# Patient Record
Sex: Female | Born: 1935 | Race: White | Hispanic: No | Smoking: Never smoker
Health system: Southern US, Community
[De-identification: ages and names within clinical notes are randomized; demographics above are authoritative.]

## PROBLEM LIST (undated history)

## (undated) DIAGNOSIS — K317 Polyp of stomach and duodenum: Secondary | ICD-10-CM

## (undated) DIAGNOSIS — Z86010 Personal history of colonic polyps: Secondary | ICD-10-CM

## (undated) DIAGNOSIS — K449 Diaphragmatic hernia without obstruction or gangrene: Secondary | ICD-10-CM

## (undated) DIAGNOSIS — H269 Unspecified cataract: Secondary | ICD-10-CM

## (undated) DIAGNOSIS — K649 Unspecified hemorrhoids: Secondary | ICD-10-CM

## (undated) DIAGNOSIS — Q2730 Arteriovenous malformation, site unspecified: Secondary | ICD-10-CM

## (undated) DIAGNOSIS — K5521 Angiodysplasia of colon with hemorrhage: Secondary | ICD-10-CM

## (undated) DIAGNOSIS — K2270 Barrett's esophagus without dysplasia: Secondary | ICD-10-CM

## (undated) DIAGNOSIS — M1990 Unspecified osteoarthritis, unspecified site: Secondary | ICD-10-CM

## (undated) DIAGNOSIS — K222 Esophageal obstruction: Secondary | ICD-10-CM

## (undated) DIAGNOSIS — K219 Gastro-esophageal reflux disease without esophagitis: Secondary | ICD-10-CM

## (undated) HISTORY — PX: CARPAL TUNNEL RELEASE: SHX101

## (undated) HISTORY — DX: Gastro-esophageal reflux disease without esophagitis: K21.9

## (undated) HISTORY — DX: Unspecified hemorrhoids: K64.9

## (undated) HISTORY — DX: Unspecified osteoarthritis, unspecified site: M19.90

## (undated) HISTORY — PX: UPPER GASTROINTESTINAL ENDOSCOPY: SHX188

## (undated) HISTORY — DX: Diaphragmatic hernia without obstruction or gangrene: K44.9

## (undated) HISTORY — DX: Arteriovenous malformation, site unspecified: Q27.30

## (undated) HISTORY — PX: HIATAL HERNIA REPAIR: SHX195

## (undated) HISTORY — DX: Personal history of colonic polyps: Z86.010

## (undated) HISTORY — PX: COLONOSCOPY: SHX174

## (undated) HISTORY — PX: POLYPECTOMY: SHX149

## (undated) HISTORY — DX: Unspecified cataract: H26.9

## (undated) HISTORY — DX: Barrett's esophagus without dysplasia: K22.70

## (undated) HISTORY — DX: Angiodysplasia of colon with hemorrhage: K55.21

## (undated) HISTORY — DX: Polyp of stomach and duodenum: K31.7

## (undated) HISTORY — DX: Esophageal obstruction: K22.2

## (undated) HISTORY — PX: SKIN CANCER EXCISION: SHX779

---

## 1981-02-26 HISTORY — PX: NISSEN FUNDOPLICATION: SHX2091

## 1997-11-26 ENCOUNTER — Other Ambulatory Visit: Admission: RE | Admit: 1997-11-26 | Discharge: 1997-11-26 | Payer: Self-pay | Admitting: *Deleted

## 1998-02-09 ENCOUNTER — Ambulatory Visit (HOSPITAL_COMMUNITY): Admission: RE | Admit: 1998-02-09 | Discharge: 1998-02-09 | Payer: Self-pay | Admitting: Gastroenterology

## 1998-02-09 ENCOUNTER — Encounter: Payer: Self-pay | Admitting: Gastroenterology

## 1998-06-10 ENCOUNTER — Encounter: Payer: Self-pay | Admitting: Family Medicine

## 1998-06-10 ENCOUNTER — Ambulatory Visit (HOSPITAL_COMMUNITY): Admission: RE | Admit: 1998-06-10 | Discharge: 1998-06-10 | Payer: Self-pay

## 1999-03-08 ENCOUNTER — Other Ambulatory Visit: Admission: RE | Admit: 1999-03-08 | Discharge: 1999-03-08 | Payer: Self-pay | Admitting: Family Medicine

## 1999-06-16 ENCOUNTER — Encounter: Payer: Self-pay | Admitting: Family Medicine

## 1999-06-16 ENCOUNTER — Ambulatory Visit (HOSPITAL_COMMUNITY): Admission: RE | Admit: 1999-06-16 | Discharge: 1999-06-16 | Payer: Self-pay | Admitting: Family Medicine

## 2000-04-15 ENCOUNTER — Other Ambulatory Visit: Admission: RE | Admit: 2000-04-15 | Discharge: 2000-04-15 | Payer: Self-pay | Admitting: Gastroenterology

## 2000-04-15 ENCOUNTER — Encounter (INDEPENDENT_AMBULATORY_CARE_PROVIDER_SITE_OTHER): Payer: Self-pay | Admitting: Specialist

## 2001-06-23 ENCOUNTER — Encounter: Payer: Self-pay | Admitting: Gastroenterology

## 2001-06-23 DIAGNOSIS — K649 Unspecified hemorrhoids: Secondary | ICD-10-CM | POA: Insufficient documentation

## 2001-06-23 DIAGNOSIS — K5521 Angiodysplasia of colon with hemorrhage: Secondary | ICD-10-CM | POA: Insufficient documentation

## 2001-09-04 ENCOUNTER — Encounter: Payer: Self-pay | Admitting: Family Medicine

## 2001-09-04 ENCOUNTER — Ambulatory Visit (HOSPITAL_COMMUNITY): Admission: RE | Admit: 2001-09-04 | Discharge: 2001-09-04 | Payer: Self-pay | Admitting: Family Medicine

## 2002-01-02 ENCOUNTER — Encounter: Payer: Self-pay | Admitting: Emergency Medicine

## 2002-01-02 ENCOUNTER — Inpatient Hospital Stay (HOSPITAL_COMMUNITY): Admission: EM | Admit: 2002-01-02 | Discharge: 2002-01-06 | Payer: Self-pay | Admitting: Emergency Medicine

## 2002-01-03 ENCOUNTER — Encounter: Payer: Self-pay | Admitting: Internal Medicine

## 2002-01-26 ENCOUNTER — Encounter: Admission: RE | Admit: 2002-01-26 | Discharge: 2002-01-26 | Payer: Self-pay | Admitting: Internal Medicine

## 2002-04-28 ENCOUNTER — Encounter: Admission: RE | Admit: 2002-04-28 | Discharge: 2002-04-28 | Payer: Self-pay | Admitting: Internal Medicine

## 2002-10-12 DIAGNOSIS — K219 Gastro-esophageal reflux disease without esophagitis: Secondary | ICD-10-CM

## 2004-01-04 ENCOUNTER — Ambulatory Visit: Payer: Self-pay | Admitting: Gastroenterology

## 2004-02-27 HISTORY — PX: TOE AMPUTATION: SHX809

## 2004-06-28 ENCOUNTER — Inpatient Hospital Stay (HOSPITAL_COMMUNITY): Admission: EM | Admit: 2004-06-28 | Discharge: 2004-06-30 | Payer: Self-pay | Admitting: Emergency Medicine

## 2004-10-11 ENCOUNTER — Ambulatory Visit: Payer: Self-pay | Admitting: Gastroenterology

## 2004-10-23 ENCOUNTER — Encounter (INDEPENDENT_AMBULATORY_CARE_PROVIDER_SITE_OTHER): Payer: Self-pay | Admitting: Specialist

## 2004-10-23 ENCOUNTER — Ambulatory Visit: Payer: Self-pay | Admitting: Gastroenterology

## 2004-10-23 DIAGNOSIS — K449 Diaphragmatic hernia without obstruction or gangrene: Secondary | ICD-10-CM | POA: Insufficient documentation

## 2004-10-23 DIAGNOSIS — D131 Benign neoplasm of stomach: Secondary | ICD-10-CM

## 2004-10-23 DIAGNOSIS — D1330 Benign neoplasm of unspecified part of small intestine: Secondary | ICD-10-CM

## 2004-11-09 ENCOUNTER — Ambulatory Visit (HOSPITAL_COMMUNITY): Admission: RE | Admit: 2004-11-09 | Discharge: 2004-11-09 | Payer: Self-pay | Admitting: Gastroenterology

## 2005-03-06 ENCOUNTER — Ambulatory Visit: Payer: Self-pay | Admitting: Gastroenterology

## 2006-01-03 IMAGING — CR DG FOOT COMPLETE 3+V*R*
3 series · 3 of 3 positions shown · non-contrast
Comparison: none

CLINICAL DATA: Lawnmower ran over foot.
 RIGHT FOOT- 3 VIEWS:
 There has been amputation of the great toe at the level of the proximal phalanx.  This is attached by skin, with multiple comminuted bone fragments present.  There also is amputation of the second toe at the level of the DIP.  Question of a non-displaced fracture of the distal aspect of the third proximal phalanx.
 There is debris in the soft tissues.

[view not recorded (1 of 3)]
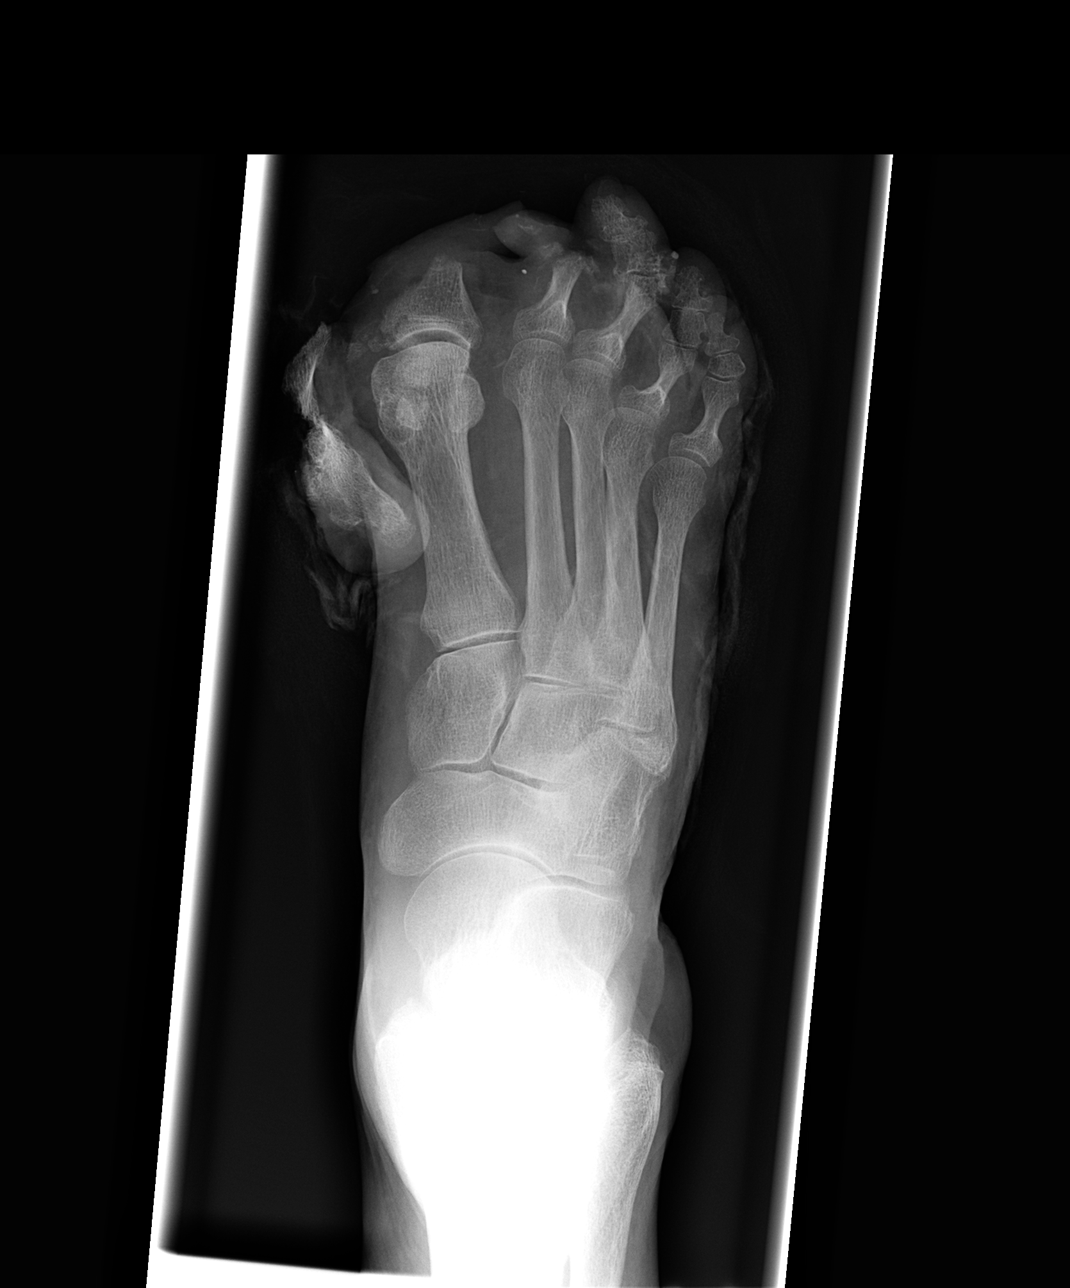

[view not recorded (2 of 3)]
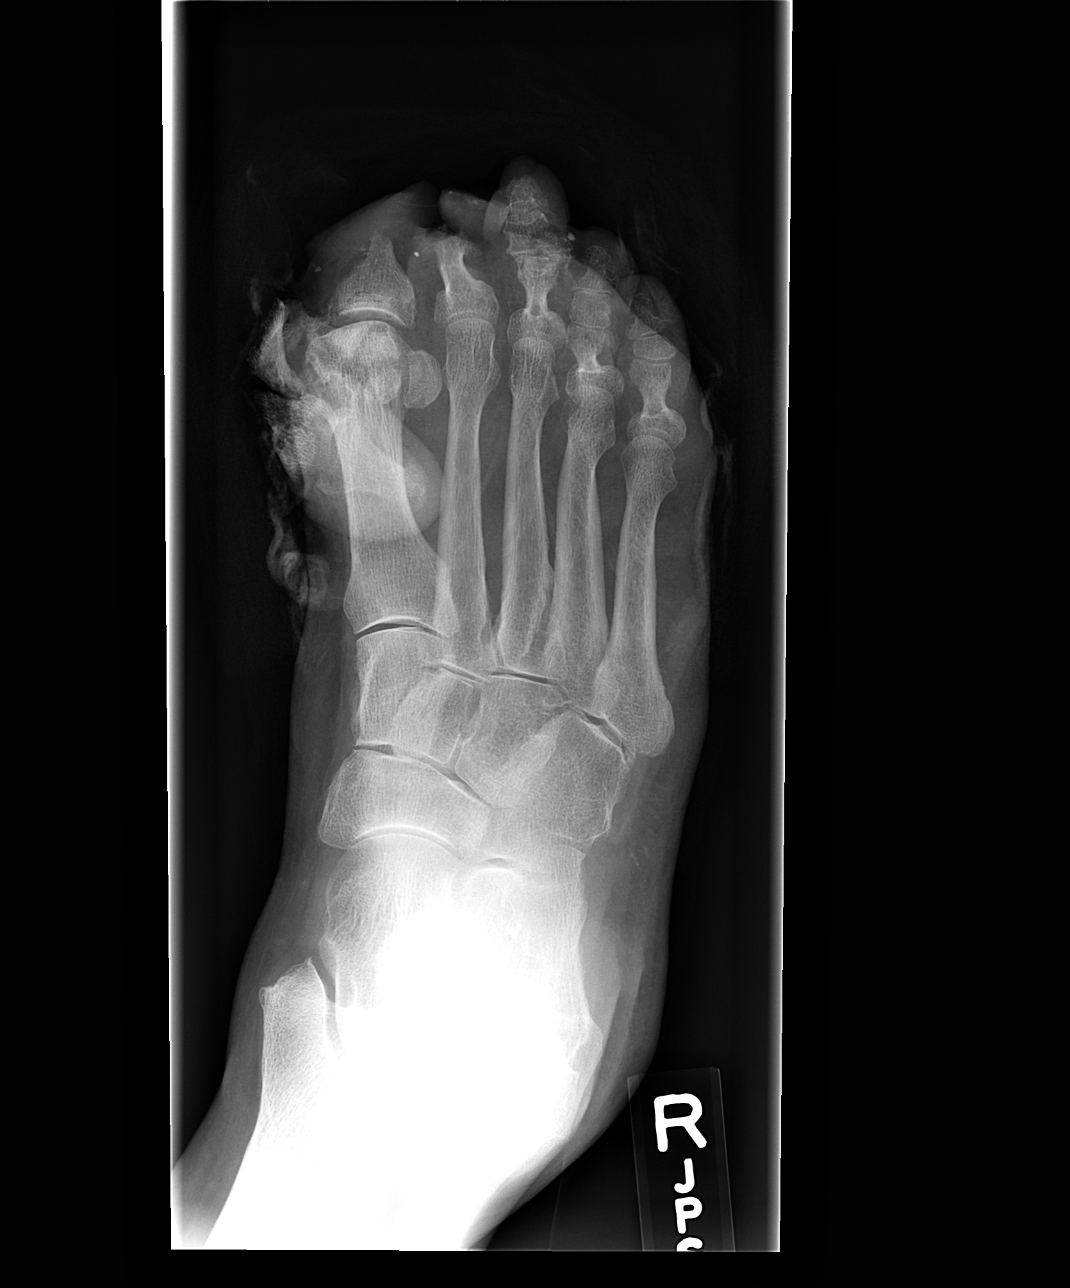

[view not recorded (3 of 3)]
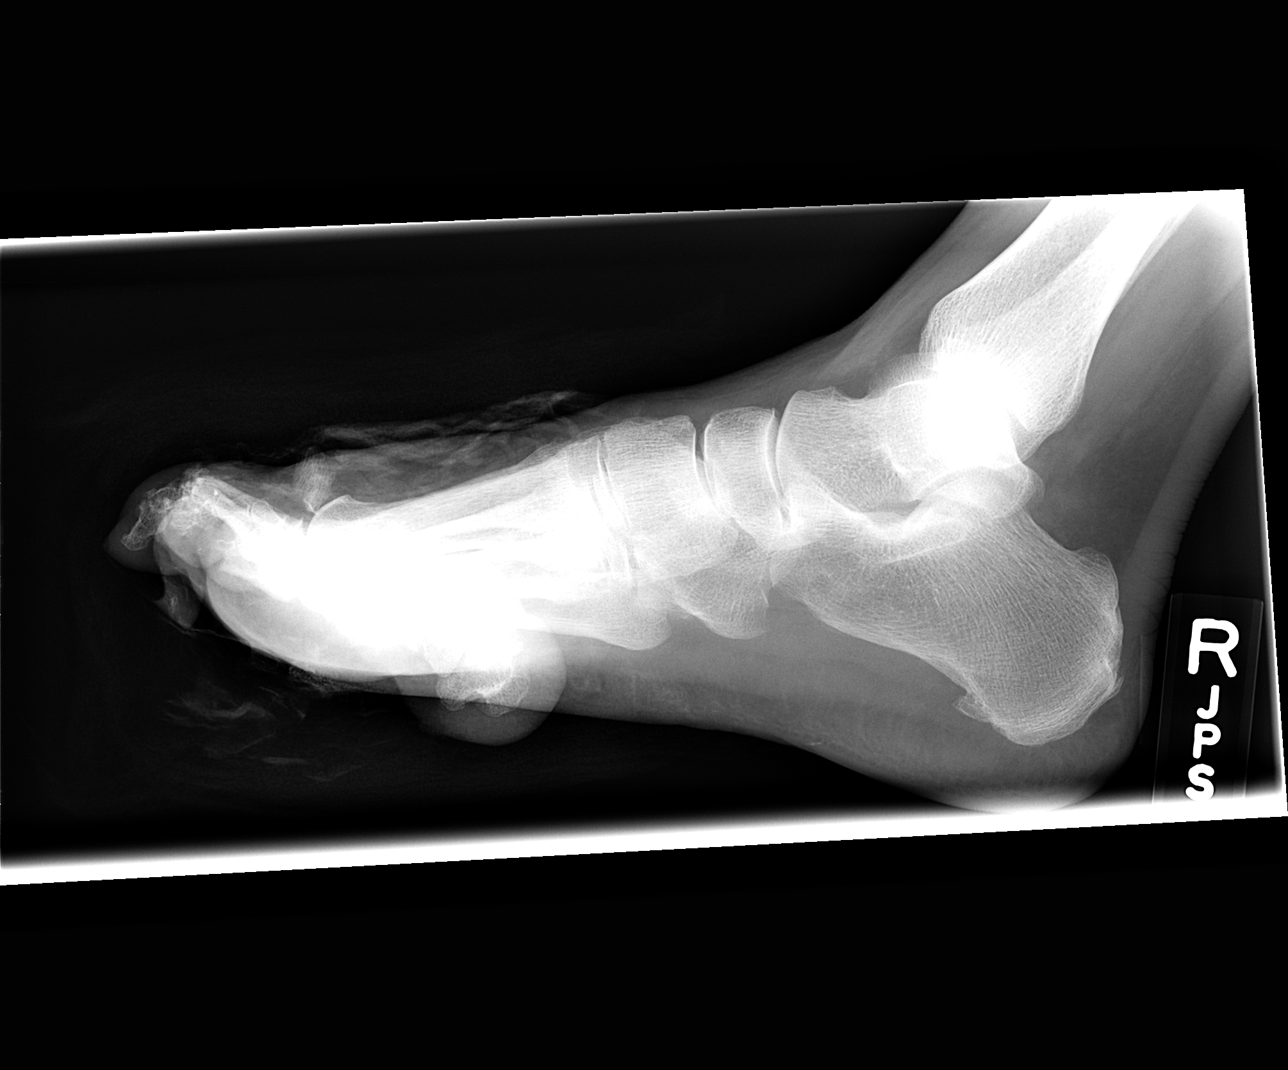

[3 of 3 positions shown; findings below may reference images not displayed]

IMPRESSION: Traumatic amputation of the first and second toes and a possible fracture of the third toe.

## 2006-06-20 ENCOUNTER — Ambulatory Visit (HOSPITAL_COMMUNITY): Admission: RE | Admit: 2006-06-20 | Discharge: 2006-06-20 | Payer: Self-pay | Admitting: Gastroenterology

## 2006-06-20 ENCOUNTER — Encounter: Payer: Self-pay | Admitting: Gastroenterology

## 2006-06-20 DIAGNOSIS — K317 Polyp of stomach and duodenum: Secondary | ICD-10-CM

## 2006-06-20 HISTORY — DX: Polyp of stomach and duodenum: K31.7

## 2006-06-21 ENCOUNTER — Encounter (INDEPENDENT_AMBULATORY_CARE_PROVIDER_SITE_OTHER): Payer: Self-pay | Admitting: *Deleted

## 2006-06-25 ENCOUNTER — Ambulatory Visit: Payer: Self-pay | Admitting: Gastroenterology

## 2006-08-23 ENCOUNTER — Ambulatory Visit: Payer: Self-pay | Admitting: Gastroenterology

## 2007-12-23 ENCOUNTER — Telehealth: Payer: Self-pay | Admitting: Gastroenterology

## 2008-01-12 DIAGNOSIS — K222 Esophageal obstruction: Secondary | ICD-10-CM

## 2008-01-12 DIAGNOSIS — K2270 Barrett's esophagus without dysplasia: Secondary | ICD-10-CM

## 2008-02-10 ENCOUNTER — Ambulatory Visit: Payer: Self-pay | Admitting: Gastroenterology

## 2008-02-27 HISTORY — PX: CATARACT EXTRACTION: SUR2

## 2008-03-15 ENCOUNTER — Encounter: Payer: Self-pay | Admitting: Gastroenterology

## 2008-03-15 ENCOUNTER — Ambulatory Visit: Payer: Self-pay | Admitting: Gastroenterology

## 2008-03-16 ENCOUNTER — Encounter: Payer: Self-pay | Admitting: Gastroenterology

## 2009-05-19 ENCOUNTER — Telehealth: Payer: Self-pay | Admitting: Gastroenterology

## 2009-06-03 ENCOUNTER — Encounter (INDEPENDENT_AMBULATORY_CARE_PROVIDER_SITE_OTHER): Payer: Self-pay | Admitting: *Deleted

## 2009-06-07 ENCOUNTER — Telehealth: Payer: Self-pay | Admitting: Gastroenterology

## 2010-03-30 NOTE — Progress Notes (Signed)
Summary: medication  Phone Note Call from Patient Call back at Home Phone [phone]   Caller: Patient Call For: Dr. Jarold Motto Reason for Call: Talk to Nurse Summary of Call: [facility] does not carry Protonix -- they only carry the generic of Protonix. Would like to discuss it with you Initial call taken by: Karna Christmas,  May 19, 2009 10:45 AM  Follow-up for Phone Call        Pt asking if it is OK to take the genreic for protonix.  Pt informed that this will be Ok.  Pt req rx be sent to [facility] at [phone]. Rx faxed as requested/ Follow-up by: Ashok Cordia RN,  May 19, 2009 1:12 PM    New/Updated Medications: PANTOPRAZOLE SODIUM 40 MG TBEC (PANTOPRAZOLE SODIUM) 1 by mouth qd Prescriptions: PANTOPRAZOLE SODIUM 40 MG TBEC (PANTOPRAZOLE SODIUM) 1 by mouth qd  #90 x 3   Entered by:   Ashok Cordia RN   Authorized by:   Mardella Layman MD [facility]   Signed by:   Ashok Cordia RN on 05/19/2009   Method used:   Printed then faxed to ...       [facility]       [address]       [place], [place]  [address]       Ph: [phone]       Fax: [phone]   RxID:   [id number]

## 2010-03-30 NOTE — Progress Notes (Signed)
Summary: COL?  Phone Note Call from Patient Call back at [facility] Phone [phone]   Caller: Patient Call For: Dr. Jarold Motto Reason for Call: Talk to Nurse Summary of Call: received recall letter for EGD... IDX indicates pt should also have COL, thus an ECL... pt doesnt think this is correct and also thinks she should have all COL's in hospital... pt would like to discuss this with nurse Initial call taken by: Vallarie Mare,  June 07, 2009 11:06 AM  Follow-up for Phone Call        Pt had EGD 1/10 and per reoprt repeat EGD not needed for 3 years.  Last colon was 2003.  Pt informed would need colon repeat every 5 years per reoprt.  Pt states she really does not want to have a colon at the present time.  Discussed reasons for having screening colonoscops with pt. Follow-up by: Ashok Cordia RN,  June 07, 2009 4:47 PM

## 2010-03-30 NOTE — Letter (Signed)
Summary: Endo/Colon Letter  [facility]  [address] [place], [place] [address]   Phone: [phone]  Fax: [phone]      June 03, 2009 MRN: [id number]   Samantha Ayala [address] [place], [place]  [address]   Dear Samantha Ayala,   According to your medical record, it is time for you to schedule an Endoscopy/Colonoscopy . Endoscopic screeening is recommended for patients with certain upper digestive tract conditions because of associated increased risk for cancers of the upper digestive system. The American Cancer Society recommends Colonoscopy as a method to detect early colon cancer. Patients with a family history of colon cancer, or a personal history of colon polyps or inflammatory bowel disease are at increased risk.  This letter has been generated based on the recommendations made at the time of your prior procedure. If you feel that in your particular situation this may no longer apply, please contact our office.  Please call our office at [phone] to schedule this appointment or to update your records at your earliest convenience.  Thank you for cooperating with [place] to provide you with the very best care possible.   Sincerely,    Vania Rea. Jarold Motto, M.D.  [facility] [phone]

## 2010-07-14 NOTE — Op Note (Signed)
NAMENYEISHA, GOODALL               ACCOUNT NO.:  [id number]   MEDICAL RECORD NO.:  [id number]          PATIENT TYPE:  INP   LOCATION:  5019                         FACILITY:  [facility]   PHYSICIAN:  Mark C. Ophelia Charter, M.D.    DATE OF BIRTH:  [birth year]   DATE OF PROCEDURE:  06/28/2004  DATE OF DISCHARGE:                                 OPERATIVE REPORT   PREOPERATIVE DIAGNOSIS:  Right foot and toe lawnmower injury with  amputations.   POSTOPERATIVE DIAGNOSIS:  Right foot and toe lawnmower injury with  amputations.   PROCEDURE:  1.  Right great toe completion of amputation at metatarsophalangeal joint.  2.  Amputation of second toe through proximal phalanx.  3.  Open reduction and internal fixation of open fracture and debridement of      third toe with pinning.   SURGEON:  Mark C. Ophelia Charter, M.D.   TOURNIQUET TIME:  Less than 45 minutes.   BRIEF HISTORY:  This 75 year old female got her foot underneath the  lawnmower with great toe hanging on by a 3-mm skin bridge, flipped back on  the dorsum of the foot with obvious open fractures and significant gross  contamination of the great toe and second toe.  The second toe was gone with  no distal tip seen and must have in the lawnmower.  The third toe had open  fractures with gross contamination of dirt and grime and grass.   PROCEDURE:  After induction of IV sedation with a preoperative ankle block,  standard Betadine scrub and prep were performed with extensive scrubbing and  debriding.  Stockinette extremity sheets and drapes were used.  The proximal  thigh tourniquet was inflated at 250 during prepping to prevent significant  bleeding.  Pulse lavage was used.  Initially, the great toe was divided,  dividing the 2- to 3-mm skin bridge, completing the amputation.  The toe was  pushed off to the side in case skin was needed later and pulse lavage was  used, debriding grime, bone and dirt.  There was about 1/4 of the proximal  phalanx  remaining, which was at an oblique angle with a sharp spike, and  flexor tendons were cut back on stretch, neurovascular bundles were  controlled with cautery and remaining portion of the proximal phalanx was  removed, completing amputation through the MP joint.  Pulsatile lavage was  used to aid in cleaning up meticulously with debridement, sponge and sharp  debridement with a scalpel, use of Mayo scissors and repeat debrided; this  was repeated 3 or 4 times, so finally the operative field was completely  clean.  An identical procedure was repeated for the second toe, which had to  have the skin trimmed back, bone debrided back from the amputation at the  proximal phalanx level.  The end of the bone was smooth and there was less  contamination in the great toe, but still a significant amount of grass and  dirt which were pulsatile-lavaged.  The third toe had contamination  similarly with medial skin intact, opening on the lateral aspect of the toe,  split  longitudinal with proximal and middle phalanx with significant  comminution involving the condyle; sharp debridement of bone with grime and  grass had to be performed.  The distal tip had good capillary refill due to  the medial neurovascular bundle.  A 0.45 K-wire was used antegrade followed  by retrograde, placing the toe in appropriate position; care was taken to  make sure it was not over-distracted.  Lateral, the neurovascular bundle was  bipolared.  Skin was closed with 3-0 nylon for all 3 toes.  Xeroform, 4 x  4's, Webril and soft dressing was applied, and the patient was transferred  to the recovery room.  Instrument count and needle count were correct.  Tourniquet was deflated prior to closure.  The patient tolerated the  procedure well.      MCY/MEDQ  D:  06/28/2004  T:  06/29/2004  Job:  [id number]

## 2010-07-14 NOTE — Discharge Summary (Signed)
NAME:  Samantha Ayala, HARTNEY                         ACCOUNT NO.:  [id number]   MEDICAL RECORD NO.:  [id number]                   PATIENT TYPE:  INP   LOCATION:  5735                                 FACILITY:  [facility]   PHYSICIAN:  Dineen Kid. Reche Dixon, M.D.               DATE OF BIRTH:  1936-01-06   DATE OF ADMISSION:  01/02/2002  DATE OF DISCHARGE:  01/06/2002                                 DISCHARGE SUMMARY   DISCHARGE DIAGNOSES:  1. Acute-on-chronic respiratory failure.  2. Question idiopathic pulmonary fibrosis.  3. Esophageal reflux.  4. Atypical pneumonia.   DISCHARGE MEDICATIONS:  1. Protonix 40 mg p.o. q.d.  2. Enteric-coated aspirin 81 mg p.o. q.d.  3. Albuterol MDI two puffs q.6h. p.r.n. shortness of breath.  4. Tussionex 10 mg 5 to 10 cc q.12h. p.r.n. cough.  5. Prednisone 60 mg for two weeks, 30 mg for two weeks, 20 mg for one month,     10 mg for one month.  6. Zithromax 250 mg p.o. for five days.  7. Calcium with vitamin D q.d.  8. Evista q.d.   CHIEF COMPLAINT:  Shortness of breath and cough.   HISTORY OF PRESENT ILLNESS:  A 75 year old white female with past medical  history significant for hiatal hernia surgery in the 70s and osteoporosis  who presents to the emergency department with two weeks of worsening cough  and increased shortness of breath. Seen by primary doctor, Dr. Jeannetta Nap, two  weeks previous to admission with fever of 102 with chills, cough with green  sputum production, and has a chest x-ray at that time suspect of pneumonia,  given Tequin for seven days. Then, returned with minimal improvement after  the seven days, was started on azithromycin a day before admission. Again  had green sputum with cough daily. The patient has had seven month to a year  history of decline in lung function with acute one week episodes, periodic  worsening of shortness of breath with improvement back to baseline. Did not  seek any medical attention at these times. Could  previously walk one mile  without shortness of breath one year ago. Was riding a bicycle for five  miles three weeks prior to admission. 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 She has had positive for orthopnea the past two  weeks. The rest of review of systems is negative.   ALLERGIES:  No known drug allergies.   SUBSTANCE HISTORY:  The patient has never smoked but has had positive  exposure as her husband smoked. No alcohol. No cocaine. No other drug use.  Currently widowed. [place], has to go outside to an outside to use the bathroom, lives  out in [place].   FAMILY HISTORY:  Mother died at the age of 82 of some type of stomach  cancer. Father died at age 37 with Alzheimer's and phlebitis. Three sisters,  one sister with emphysema secondary to smoking.   REVIEW OF SYMPTOMS:  On admission, the patient complained of fever and  chills initially. No chest pain. No palpitations. No sore throat. Just  dyspnea and dyspnea on exertion, cough with sputum production, some  constipation, some urinary frequency, some mild weakness and numbness felt  in her limbs throughout. No headache. No dizziness. No imbalance.   PHYSICAL EXAMINATION:  VITAL SIGNS:  On admission, blood pressure 113/68,  pulse 107, temperature 97.1, respiratory rate 24, O2 94% on 2 liters.  GENERAL:  Elderly pleasant white female in no acute distress.  EYES:  Pupils are equal, round, and reactive to light and accommodation.  ENT:  Upper and lower dentures. The oropharynx is clear. Swollen turbinates.  No exudate noticed in nose or oropharynx. No lymphadenopathy.  NECK:  No JVD. No thyromegaly. No carotid bruits.  RESPIRATIONS:  Crackles heard throughout lung Carreno. No consolidation. No  hyperresonance. No wheezing.  CARDIOVASCULAR:  Regular, rate, and rhythm.  No murmurs.  GASTROINTESTINAL:  Abdomen soft, nontender, nondistended, with positive  bowel sounds. No hepatosplenomegaly.  EXTREMITIES:  No edema, 2+ pulses in all extremities.  SKIN:  No skin lesions appreciated.  MUSCULOSKELETAL:  5/5 strength in all extremities.  NEUROLOGICAL:  Cranial nerves II-XII intact. No decrease in sensation. Alert  and oriented x3.  PSYCHIATRIC:  No depressive features.   LABORATORY DATA:  ABG of 7.48, CO2 of 34, O2 of 50, Aa gradient of 19, FiO2  was on room air. Sodium 133, potassium 4.5, chloride 101, bicarb 27, BUN 9,  creatinine 0.6, glucose 109. WBC 9.8, hemoglobin 12.3, platelets 335, ANC  7.8, MCV 83.   Other labs and tests obtained during hospitalization showed total protein of  6.7, albumin 3.1, AST 33, ALT 33, alkaline phosphatase 97, total bilirubin  3.6. BNP of less than 30. Urinalysis was essentially negative. ESR was 79.  Chest x-ray was read as interstitial prominence which is likely related to  chronic fibrosis. Interstitial lumenitis is not seen and felt to be less  likely at this time. No effusions, collapse, or consolidations seen. CT scan  obtained showed diffuse ground-glass appearance, question of alveolitis,  basilar dependent subsegmental atelectasis.   Pulmonary function tests show FVC of 59% of predicted, FEV1 of 52 of  predicted, FEF 25/75 37. Total lung capacity greater than 74% of predicted.  Diffusion DSB is 14% of predicted. Computerized interpretation showed mild  obstructive disease, additional mild restrictive disease, and marked  decrease in diffusion capacity. No bronchodilator study was performed.   HOSPITAL COURSE:  Problem 1. Acute-on-chronic respiratory failure. Ms.  Sonnen apparently on admission had an acute problem with shortness of  breath, possibly secondary to pneumonia. Following minimal improvement with continued bronchodilators as well as steroids, considered the probability  that this may be a pulmonary  fibrosis-type picture, given her history of  slow decline over the past year. Continued with antibiotic treatment with  Zithromax during hospitalization as well as upon discharge. Was given  Tussionex for cough during hospitalization and obtained a CT scan which  showed the above findings of ground-glass alveolitis picture. Due to the  fact that it was unclear what was going on with patient's shortness of  breath, did get a pulmonary consult with Dr. Jayme Cloud who apparently  suspected that she might have interstitial or idiopathic  pulmonary fibrosis.  Whether it came from occupational exposure versus infection versus chronic  GERD, it is hard to tell. She stated that she would not recommend a VATS  procedure at the time for tissue diagnosis and see how she responds to a  short course of steroids, initially starting with IV steroids and then  switching to p.o. with a slow taper and then follow clinically and continue  antibiotic treatment for suspected pneumonia for a total of 10 days. Upon  discharge, the patient was having much less trouble with her breathing as  well as much improvement in her cough. She was only on nebulizer treatment  p.r.n. basis and requiring minimal albuterol for symptom relief.  Problem 2. Gastroesophageal reflux disease. The patient has continued taking  Protonix 40 mg p.o. q.d. Also given the fact that she was started on a taper  of prednisone for her lung issues.   DISPOSITION:  The patient was encouraged to avoid contact with second-hand  smoke, avoid prolonged exposure to cold air. Consider moving to somewhere  where she has running water and she would not have to go outside to use the  bathroom. Upon discharge, she is going to live with her sister for several  weeks until able to find a place for her to live. She is encouraged to  return to the emergency department if any increase in shortness of breath or  she has any abnormal side effects to the medicine.  She is to return to the  outpatient clinic in two to three weeks for hospital followup. Clinic number  is given, [phone], if she has any problems or questions. She is to return  to Dr. Jeannetta Nap for any further routine health maintenance.     Catalina Pizza, M.D.                           Dineen Kid Reche Dixon, M.D.    ZH/MEDQ  D:  02/04/2002  T:  02/05/2002  Job:  [id number]   cc:   Jeannetta Nap, M.D.

## 2010-09-25 ENCOUNTER — Telehealth: Payer: Self-pay | Admitting: *Deleted

## 2010-09-25 NOTE — Telephone Encounter (Signed)
Pt agreed to schedule an ov but not a ECL. Ov 10/10/2010

## 2010-10-10 ENCOUNTER — Ambulatory Visit (INDEPENDENT_AMBULATORY_CARE_PROVIDER_SITE_OTHER): Payer: 59 | Admitting: Gastroenterology

## 2010-10-10 ENCOUNTER — Other Ambulatory Visit (INDEPENDENT_AMBULATORY_CARE_PROVIDER_SITE_OTHER): Payer: 59

## 2010-10-10 ENCOUNTER — Encounter: Payer: Self-pay | Admitting: Gastroenterology

## 2010-10-10 VITALS — BP 120/70 | HR 76 | Ht 63.0 in | Wt 210.8 lb

## 2010-10-10 DIAGNOSIS — K2270 Barrett's esophagus without dysplasia: Secondary | ICD-10-CM

## 2010-10-10 DIAGNOSIS — K219 Gastro-esophageal reflux disease without esophagitis: Secondary | ICD-10-CM

## 2010-10-10 LAB — CBC WITH DIFFERENTIAL/PLATELET
Eosinophils Absolute: 0.1 10*3/uL (ref 0.0–0.7)
HCT: 42.9 % (ref 36.0–46.0)
MCV: 87.5 fl (ref 78.0–100.0)
Monocytes Absolute: 0.5 10*3/uL (ref 0.1–1.0)
Monocytes Relative: 7.4 % (ref 3.0–12.0)
Neutrophils Relative %: 63 % (ref 43.0–77.0)
Platelets: 232 10*3/uL (ref 150.0–400.0)
RBC: 4.91 Mil/uL (ref 3.87–5.11)

## 2010-10-10 LAB — FERRITIN: Ferritin: 39.5 ng/mL (ref 10.0–291.0)

## 2010-10-10 LAB — VITAMIN B12: Vitamin B-12: 207 pg/mL — ABNORMAL LOW (ref 211–911)

## 2010-10-10 LAB — BASIC METABOLIC PANEL
BUN: 17 mg/dL (ref 6–23)
CO2: 26 mEq/L (ref 19–32)
Calcium: 9.6 mg/dL (ref 8.4–10.5)
Chloride: 103 mEq/L (ref 96–112)
Creatinine, Ser: 0.7 mg/dL (ref 0.4–1.2)
GFR: 85.63 mL/min (ref >60.00)
Glucose, Bld: 94 mg/dL (ref 70–99)
Potassium: 4.2 mEq/L (ref 3.5–5.1)

## 2010-10-10 LAB — IBC PANEL
Iron: 108 ug/dL (ref 42–145)
Saturation Ratios: 24.2 % (ref 20.0–50.0)
Transferrin: 319.4 mg/dL (ref 212.0–360.0)

## 2010-10-10 LAB — FOLATE: Folate: 23.3 ng/mL (ref >5.9)

## 2010-10-10 LAB — HEPATIC FUNCTION PANEL: Total Bilirubin: 0.6 mg/dL (ref 0.3–1.2)

## 2010-10-10 MED ORDER — PANTOPRAZOLE SODIUM 40 MG PO TBEC: 40.0000 mg | DELAYED_RELEASE_TABLET | Freq: Every day | ORAL | Status: AC

## 2010-10-10 NOTE — Progress Notes (Signed)
This is a very nice 75 year old white female with chronic GERD doing well daily Protonix therapy. She has mild constipation but is up-to-date on her colonoscopy exams. She denies gastrointestinal issues at this time. She does have Barrett's mucosa and is due for followup in January 2013. She specifically denies dysphagia, melena or hematochezia. She has had previous removal of a duodenal polyp by endoscopic techniques per Dr. Rob Bunting. The patient is due for colonoscopy but refuses exam.  Current Medications, Allergies, Past Medical History, Past Surgical History, Family History and Social History were reviewed in [employer] record.  Pertinent Review of Systems Negative   Physical Exam: Awake and alert no acute distress appears stated age. Chest is clear cardiac exam is unremarkable. There is no hepatosplenomegaly, abdominal masses or tenderness. Bowel sounds are normal. Peripheral extremities are unremarkable. Mental status is clear.    Assessment and Plan: We will continue PPI therapy with every three-year endoscopy and surveillance biopsies. The patient has refused colonoscopy. I have asked her to do IFOB records for occult blood, and we will check CBC and iron levels. I reviewed an anti-reflex regime with her, and will continue daily Protonix. Encounter Diagnosis  Name Primary?  . Barrett's esophagus Yes

## 2010-10-10 NOTE — Patient Instructions (Addendum)
Please go to the basement today for your labs.   

## 2010-10-11 ENCOUNTER — Telehealth: Payer: Self-pay | Admitting: *Deleted

## 2010-10-11 NOTE — Telephone Encounter (Signed)
Pt aware and she has no car at this time she will call back to schedule b12 injections when she gets a car.

## 2010-10-11 NOTE — Telephone Encounter (Signed)
Message copied by Leonette Monarch on Wed Oct 11, 2010  8:43 AM ------      Message from: PATTERSON, DAVID R      Created: Tue Oct 10, 2010  5:05 PM       B12 shots x3 then weekly nasal spray.

## 2010-10-12 ENCOUNTER — Ambulatory Visit (INDEPENDENT_AMBULATORY_CARE_PROVIDER_SITE_OTHER): Payer: 59 | Admitting: Gastroenterology

## 2010-10-12 DIAGNOSIS — E538 Deficiency of other specified B group vitamins: Secondary | ICD-10-CM

## 2010-10-12 MED ORDER — CYANOCOBALAMIN 1000 MCG/ML IJ SOLN
1000.0000 ug | INTRAMUSCULAR | Status: AC
  Administered 2010-10-12 – 2010-10-27 (×3): 1000 ug via INTRAMUSCULAR

## 2010-10-19 ENCOUNTER — Ambulatory Visit (INDEPENDENT_AMBULATORY_CARE_PROVIDER_SITE_OTHER): Payer: 59 | Admitting: Gastroenterology

## 2010-10-19 DIAGNOSIS — E538 Deficiency of other specified B group vitamins: Secondary | ICD-10-CM

## 2010-10-27 ENCOUNTER — Ambulatory Visit (INDEPENDENT_AMBULATORY_CARE_PROVIDER_SITE_OTHER): Payer: 59 | Admitting: Gastroenterology

## 2010-10-27 DIAGNOSIS — K5521 Angiodysplasia of colon with hemorrhage: Secondary | ICD-10-CM

## 2010-10-27 DIAGNOSIS — E538 Deficiency of other specified B group vitamins: Secondary | ICD-10-CM

## 2010-11-06 ENCOUNTER — Telehealth: Payer: Self-pay | Admitting: *Deleted

## 2010-11-06 NOTE — Telephone Encounter (Signed)
Pt returned her ifob kit and the lab is in the process of resultsing them per Archie Patten, pt aware

## 2010-11-06 NOTE — Telephone Encounter (Signed)
Message copied by Leonette Monarch on Mon Nov 06, 2010  8:57 AM ------      Message from: Harlow Mares D      Created: Thu Oct 12, 2010  4:36 PM       Check on ifob

## 2010-11-10 ENCOUNTER — Other Ambulatory Visit: Payer: Self-pay | Admitting: Gastroenterology

## 2010-11-10 ENCOUNTER — Other Ambulatory Visit: Payer: 59

## 2010-11-10 DIAGNOSIS — Z1211 Encounter for screening for malignant neoplasm of colon: Secondary | ICD-10-CM

## 2010-11-10 LAB — FECAL OCCULT BLOOD, IMMUNOCHEMICAL: Fecal Occult Bld: NEGATIVE

## 2010-11-27 ENCOUNTER — Ambulatory Visit (INDEPENDENT_AMBULATORY_CARE_PROVIDER_SITE_OTHER): Payer: 59 | Admitting: Gastroenterology

## 2010-11-27 DIAGNOSIS — E538 Deficiency of other specified B group vitamins: Secondary | ICD-10-CM

## 2010-11-27 MED ORDER — CYANOCOBALAMIN 1000 MCG/ML IJ SOLN
1000.0000 ug | Freq: Once | INTRAMUSCULAR | Status: AC
  Administered 2010-11-27: 1000 ug via INTRAMUSCULAR

## 2010-12-28 ENCOUNTER — Ambulatory Visit (INDEPENDENT_AMBULATORY_CARE_PROVIDER_SITE_OTHER): Payer: 59 | Admitting: Gastroenterology

## 2010-12-28 DIAGNOSIS — E538 Deficiency of other specified B group vitamins: Secondary | ICD-10-CM

## 2010-12-28 MED ORDER — CYANOCOBALAMIN 1000 MCG/ML IJ SOLN
1000.0000 ug | Freq: Once | INTRAMUSCULAR | Status: AC
  Administered 2010-12-28: 1000 ug via INTRAMUSCULAR

## 2011-01-29 ENCOUNTER — Other Ambulatory Visit: Payer: Self-pay | Admitting: Gastroenterology

## 2011-01-29 ENCOUNTER — Ambulatory Visit (INDEPENDENT_AMBULATORY_CARE_PROVIDER_SITE_OTHER): Payer: Medicare Other | Admitting: Gastroenterology

## 2011-01-29 DIAGNOSIS — D519 Vitamin B12 deficiency anemia, unspecified: Secondary | ICD-10-CM

## 2011-01-29 DIAGNOSIS — D518 Other vitamin B12 deficiency anemias: Secondary | ICD-10-CM

## 2011-01-29 MED ORDER — CYANOCOBALAMIN 1000 MCG/ML IJ SOLN: 1000.0000 ug | Freq: Once | INTRAMUSCULAR | Status: DC

## 2011-01-29 MED ORDER — CYANOCOBALAMIN 1000 MCG/ML IJ SOLN
1000.0000 ug | Freq: Once | INTRAMUSCULAR | Status: AC
  Administered 2011-01-29: 1000 ug via INTRAMUSCULAR

## 2011-02-05 ENCOUNTER — Telehealth: Payer: Self-pay | Admitting: Gastroenterology

## 2011-02-05 NOTE — Telephone Encounter (Signed)
b12 rx was sent to pharmacy and pt wants to get b12 in office. I called pharmacy and rx b12

## 2011-03-02 ENCOUNTER — Ambulatory Visit (INDEPENDENT_AMBULATORY_CARE_PROVIDER_SITE_OTHER): Payer: Medicare Other | Admitting: Gastroenterology

## 2011-03-02 DIAGNOSIS — E538 Deficiency of other specified B group vitamins: Secondary | ICD-10-CM

## 2011-03-02 MED ORDER — CYANOCOBALAMIN 1000 MCG/ML IJ SOLN
1000.0000 ug | Freq: Once | INTRAMUSCULAR | Status: AC
  Administered 2011-03-02: 1000 ug via INTRAMUSCULAR

## 2011-04-03 ENCOUNTER — Ambulatory Visit (INDEPENDENT_AMBULATORY_CARE_PROVIDER_SITE_OTHER): Payer: Medicare Other | Admitting: Gastroenterology

## 2011-04-03 DIAGNOSIS — E538 Deficiency of other specified B group vitamins: Secondary | ICD-10-CM

## 2011-04-03 MED ORDER — CYANOCOBALAMIN 1000 MCG/ML IJ SOLN: 1000.0000 ug | INTRAMUSCULAR | Status: DC

## 2011-04-03 MED ORDER — CYANOCOBALAMIN 1000 MCG/ML IJ SOLN
1000.0000 ug | INTRAMUSCULAR | Status: AC
  Administered 2011-04-03 – 2011-09-10 (×6): 1000 ug via INTRAMUSCULAR

## 2011-04-10 ENCOUNTER — Telehealth: Payer: Self-pay | Admitting: Gastroenterology

## 2011-04-10 NOTE — Telephone Encounter (Signed)
Now..no ov needed

## 2011-04-10 NOTE — Telephone Encounter (Signed)
Pt would like to know about scheduling her EGD since she has a hx of barretts her recall is due this year, does she need to schedule now or have an ov?

## 2011-04-11 ENCOUNTER — Encounter: Payer: Self-pay | Admitting: Gastroenterology

## 2011-04-11 NOTE — Telephone Encounter (Signed)
Pt is coming to bring her sister to have a colonoscopy today so she will stop by to schedule today.

## 2011-04-25 ENCOUNTER — Encounter: Payer: Self-pay | Admitting: Gastroenterology

## 2011-04-25 ENCOUNTER — Ambulatory Visit (AMBULATORY_SURGERY_CENTER): Payer: 59 | Admitting: *Deleted

## 2011-04-25 VITALS — Ht 63.0 in | Wt 199.0 lb

## 2011-04-25 DIAGNOSIS — K2270 Barrett's esophagus without dysplasia: Secondary | ICD-10-CM

## 2011-04-25 DIAGNOSIS — Z1211 Encounter for screening for malignant neoplasm of colon: Secondary | ICD-10-CM

## 2011-04-25 MED ORDER — PEG-KCL-NACL-NASULF-NA ASC-C 100 G PO SOLR: ORAL | Status: DC

## 2011-05-02 ENCOUNTER — Ambulatory Visit (INDEPENDENT_AMBULATORY_CARE_PROVIDER_SITE_OTHER): Payer: 59 | Admitting: Gastroenterology

## 2011-05-02 DIAGNOSIS — E538 Deficiency of other specified B group vitamins: Secondary | ICD-10-CM

## 2011-05-04 ENCOUNTER — Telehealth: Payer: Self-pay | Admitting: Gastroenterology

## 2011-05-04 ENCOUNTER — Telehealth: Payer: Self-pay | Admitting: *Deleted

## 2011-05-04 DIAGNOSIS — K2270 Barrett's esophagus without dysplasia: Secondary | ICD-10-CM

## 2011-05-04 DIAGNOSIS — Z1211 Encounter for screening for malignant neoplasm of colon: Secondary | ICD-10-CM

## 2011-05-04 MED ORDER — PEG-KCL-NACL-NASULF-NA ASC-C 100 G PO SOLR: ORAL | Status: DC

## 2011-05-04 NOTE — Telephone Encounter (Signed)
Pt called to tell us that she mixed her prep today and her procedure is not until next week.  New Rx for moviprep sent to pt's pharmacy; pt will pick it up. Pt has voucher for free prep.  Instructions for mixing and taking prep reviewed w/ pt. Ezra Sites

## 2011-05-04 NOTE — Telephone Encounter (Signed)
Pt will come by office today to pick up voucher. Samantha Ayala

## 2011-05-09 ENCOUNTER — Ambulatory Visit (AMBULATORY_SURGERY_CENTER): Payer: 59 | Admitting: Gastroenterology

## 2011-05-09 ENCOUNTER — Encounter: Payer: Medicare Other | Admitting: Gastroenterology

## 2011-05-09 ENCOUNTER — Encounter: Payer: Self-pay | Admitting: Gastroenterology

## 2011-05-09 VITALS — BP 153/62 | HR 65 | Temp 96.5°F | Resp 22 | Ht 63.0 in | Wt 199.0 lb

## 2011-05-09 DIAGNOSIS — K649 Unspecified hemorrhoids: Secondary | ICD-10-CM

## 2011-05-09 DIAGNOSIS — K2270 Barrett's esophagus without dysplasia: Secondary | ICD-10-CM

## 2011-05-09 DIAGNOSIS — K317 Polyp of stomach and duodenum: Secondary | ICD-10-CM

## 2011-05-09 DIAGNOSIS — K219 Gastro-esophageal reflux disease without esophagitis: Secondary | ICD-10-CM

## 2011-05-09 DIAGNOSIS — Z86010 Personal history of colonic polyps: Secondary | ICD-10-CM

## 2011-05-09 DIAGNOSIS — Z1211 Encounter for screening for malignant neoplasm of colon: Secondary | ICD-10-CM

## 2011-05-09 DIAGNOSIS — K5520 Angiodysplasia of colon without hemorrhage: Secondary | ICD-10-CM

## 2011-05-09 DIAGNOSIS — K5521 Angiodysplasia of colon with hemorrhage: Secondary | ICD-10-CM

## 2011-05-09 DIAGNOSIS — Z9889 Other specified postprocedural states: Secondary | ICD-10-CM

## 2011-05-09 DIAGNOSIS — K2210 Ulcer of esophagus without bleeding: Secondary | ICD-10-CM | POA: Insufficient documentation

## 2011-05-09 DIAGNOSIS — D131 Benign neoplasm of stomach: Secondary | ICD-10-CM

## 2011-05-09 MED ORDER — SODIUM CHLORIDE 0.9 % IV SOLN: 500.0000 mL | INTRAVENOUS | Status: DC

## 2011-05-09 NOTE — Patient Instructions (Signed)

## 2011-05-09 NOTE — Progress Notes (Signed)
Patient did not experience any of the following events: a burn prior to discharge; a fall within the facility; wrong site/side/patient/procedure/implant event; or a hospital transfer or hospital admission upon discharge from the facility. (G8907) Patient did not have preoperative order for IV antibiotic SSI prophylaxis. (G8918)  

## 2011-05-09 NOTE — Op Note (Signed)
[facility] [address] [place], [place]  [address]  COLONOSCOPY PROCEDURE REPORT  PATIENT:  Samantha Ayala, Samantha Ayala  MR#:  [id number] BIRTHDATE:  08-23-1935, 76 yrs. old  GENDER:  female ENDOSCOPIST:  Vania Rea. Jarold Motto, MD, [facility] REF. BY: PROCEDURE DATE:  05/09/2011 PROCEDURE:  Average-risk screening colonoscopy G0121 ASA CLASS:  Class II INDICATIONS:  history of polyps MEDICATIONS:   propofol (Diprivan) 200 mg IV  DESCRIPTION OF PROCEDURE:   After the risks and benefits and of the procedure were explained, informed consent was obtained. Digital rectal exam was performed and revealed no abnormalities. The LB CF-[id number] endoscope was introduced through the anus and advanced to the cecum, which was identified by both the appendix and ileocecal valve.  The quality of the prep was excellent, using MoviPrep.  The instrument was then slowly withdrawn as the colon was fully examined. 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 No polyps or cancers were seen.   Retroflexed views in the rectum revealed no abnormalities.    The scope was then withdrawn from the patient and the procedure completed.  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  REPEAT EXAM:  No  ______________________________ Vania Rea. Jarold Motto, MD, [facility]  CC:  Windle Guard, MD  n. Rosalie Doctor:   Vania Rea. Erling Arrazola at 05/09/2011 02:02 PM  Hortense Ramal, [phone]

## 2011-05-09 NOTE — Op Note (Signed)
[facility] [address] [place], [place]  [address]  ENDOSCOPY PROCEDURE REPORT  PATIENT:  Samantha Ayala, Samantha Ayala  MR#:  [id number] BIRTHDATE:  06-Oct-1935, 76 yrs. old  GENDER:  female  ENDOSCOPIST:  Vania Rea. Jarold Motto, MD, [facility] Referred by:  PROCEDURE DATE:  05/09/2011 PROCEDURE:  EGD with biopsy, 43239, EGD with biopsy for H. pylori 28413 ASA CLASS:  Class II INDICATIONS:  h/o Barrett's Esophagus  MEDICATIONS:   There was residual sedation effect present from prior procedure., propofol (Diprivan) 150 mg IV TOPICAL ANESTHETIC:  DESCRIPTION OF PROCEDURE:   After the risks and benefits of the procedure were explained, informed consent was obtained.  [facility] GIF-H180 [id number] endoscope was introduced through the mouth and advanced to the second portion of the duodenum.  The instrument was slowly withdrawn as the mucosa was fully examined. <<PROCEDUREIMAGES>>  Mild gastritis was found in the body and the antrum of the stomach. clo bx. done  There were multiple polyps identified. SEE PICTURES.MULTIPLE HYERPLASTIC FUNDAL POLYPS NOTED.  Barrett's esophagus was found. 7 CM OF BARRETT'S MUCOSA BIOPSIED,PROXIMAL AND DISTAL SPECIMENS OBTAINED.  Normal duodenal folds were noted. s/p fundoplication.    FUNDOPLICATION INTACT  The scope was then withdrawn from the patient and the procedure completed.  COMPLICATIONS:  None  ENDOSCOPIC IMPRESSION: 1) Mild gastritis in the body and the antrum of the stomach 2) Polyps, multiple 3) Barrett's esophagus 4) Normal duodenal folds 5) S/p fundoplication 1.S/P FUNDOPLICATION FOR REFRACTORY GERD,NOW ASYMPTOMATIC. 2.R/O DYSPLASIA IN BARRETT'S SEGMENT 3.R/O H.PYLORI 4.HYPERPLASTIC GASTRIC POLYPS FROM PPI USE. RECOMMENDATIONS: 1) Await biopsy results 2) Rx CLO if positive 3) continue current medications F/U PER PATH REPORT  ______________________________ Vania Rea. Jarold Motto, MD, [facility]  CC:  Windle Guard, MD  n. Rosalie Doctor:   Vania Rea.  [name] at 05/09/2011 02:21 PM  Hortense Ramal, [phone]

## 2011-05-10 ENCOUNTER — Telehealth: Payer: Self-pay | Admitting: *Deleted

## 2011-05-10 NOTE — Telephone Encounter (Signed)
  Follow up Call-  Call back number 05/09/2011  Post procedure Call Back phone  # [phone]   Permission to leave phone message Yes     Patient questions:  Do you have a fever, pain , or abdominal swelling? no Pain Score  0 *  Have you tolerated food without any problems? yes  Have you been able to return to your normal activities? yes  Do you have any questions about your discharge instructions: Diet   no Medications  no Follow up visit  no  Do you have questions or concerns about your Care? no  Actions: * If pain score is 4 or above: No action needed, pain <4.

## 2011-05-14 ENCOUNTER — Encounter: Payer: Self-pay | Admitting: Gastroenterology

## 2011-06-07 ENCOUNTER — Ambulatory Visit (INDEPENDENT_AMBULATORY_CARE_PROVIDER_SITE_OTHER): Payer: PRIVATE HEALTH INSURANCE | Admitting: Gastroenterology

## 2011-06-07 DIAGNOSIS — E538 Deficiency of other specified B group vitamins: Secondary | ICD-10-CM

## 2011-07-09 ENCOUNTER — Ambulatory Visit (INDEPENDENT_AMBULATORY_CARE_PROVIDER_SITE_OTHER): Payer: PRIVATE HEALTH INSURANCE | Admitting: Gastroenterology

## 2011-07-09 DIAGNOSIS — E538 Deficiency of other specified B group vitamins: Secondary | ICD-10-CM

## 2011-08-10 ENCOUNTER — Ambulatory Visit (INDEPENDENT_AMBULATORY_CARE_PROVIDER_SITE_OTHER): Payer: PRIVATE HEALTH INSURANCE | Admitting: Gastroenterology

## 2011-08-10 DIAGNOSIS — E538 Deficiency of other specified B group vitamins: Secondary | ICD-10-CM

## 2011-09-10 ENCOUNTER — Ambulatory Visit (INDEPENDENT_AMBULATORY_CARE_PROVIDER_SITE_OTHER): Payer: PRIVATE HEALTH INSURANCE | Admitting: Gastroenterology

## 2011-09-10 DIAGNOSIS — E538 Deficiency of other specified B group vitamins: Secondary | ICD-10-CM

## 2011-10-12 ENCOUNTER — Ambulatory Visit (INDEPENDENT_AMBULATORY_CARE_PROVIDER_SITE_OTHER): Payer: PRIVATE HEALTH INSURANCE | Admitting: Gastroenterology

## 2011-10-12 DIAGNOSIS — E538 Deficiency of other specified B group vitamins: Secondary | ICD-10-CM

## 2011-10-12 MED ORDER — CYANOCOBALAMIN 1000 MCG/ML IJ SOLN
1000.0000 ug | Freq: Once | INTRAMUSCULAR | Status: AC
  Administered 2011-10-12: 1000 ug via INTRAMUSCULAR

## 2011-11-12 ENCOUNTER — Ambulatory Visit (INDEPENDENT_AMBULATORY_CARE_PROVIDER_SITE_OTHER): Payer: PRIVATE HEALTH INSURANCE | Admitting: Gastroenterology

## 2011-11-12 DIAGNOSIS — E538 Deficiency of other specified B group vitamins: Secondary | ICD-10-CM

## 2011-11-12 MED ORDER — CYANOCOBALAMIN 1000 MCG/ML IJ SOLN
1000.0000 ug | Freq: Once | INTRAMUSCULAR | Status: AC
  Administered 2011-11-12: 1000 ug via INTRAMUSCULAR

## 2011-12-13 ENCOUNTER — Ambulatory Visit (INDEPENDENT_AMBULATORY_CARE_PROVIDER_SITE_OTHER): Payer: PRIVATE HEALTH INSURANCE | Admitting: Gastroenterology

## 2011-12-13 DIAGNOSIS — E538 Deficiency of other specified B group vitamins: Secondary | ICD-10-CM

## 2011-12-13 MED ORDER — CYANOCOBALAMIN 1000 MCG/ML IJ SOLN
1000.0000 ug | INTRAMUSCULAR | Status: DC
  Administered 2011-12-13: 1000 ug via INTRAMUSCULAR

## 2011-12-19 ENCOUNTER — Encounter: Payer: Self-pay | Admitting: *Deleted

## 2011-12-20 ENCOUNTER — Ambulatory Visit (INDEPENDENT_AMBULATORY_CARE_PROVIDER_SITE_OTHER): Payer: PRIVATE HEALTH INSURANCE | Admitting: Gastroenterology

## 2011-12-20 ENCOUNTER — Encounter: Payer: Self-pay | Admitting: Gastroenterology

## 2011-12-20 VITALS — BP 140/82 | HR 78 | Ht 63.0 in | Wt 194.6 lb

## 2011-12-20 DIAGNOSIS — K219 Gastro-esophageal reflux disease without esophagitis: Secondary | ICD-10-CM

## 2011-12-20 DIAGNOSIS — K2270 Barrett's esophagus without dysplasia: Secondary | ICD-10-CM

## 2011-12-20 DIAGNOSIS — E538 Deficiency of other specified B group vitamins: Secondary | ICD-10-CM

## 2011-12-20 NOTE — Patient Instructions (Addendum)
Vitamin B12 Deficiency Not having enough vitamin B12 is called a deficiency. Vitamin B12 is an important vitamin. Your body needs vitamin B12 to:   Make red blood cells.  Make DNA. This is the genetic material inside all of your cells.  Help your nerves work properly so they can carry messages from your brain to your body. CAUSES  Not eating enough foods that contain vitamin B12.  Not having enough stomach acid and digestive juices. The body needs these to absorb vitamin B12 from the food you eat.  Having certain digestive system diseases that make it hard to absorb vitamin B12. These diseases include Crohn's disease, chronic pancreatitis, and cystic fibrosis.  Having pernicious anemia, which is a condition where the body has too few red blood cells. People with this condition do not make enough of a protein called "intrinsic factor," which is needed to absorb vitamin B12.  Having a surgery in which part of the stomach or small intestine is removed.  Taking certain medicines that make it hard for the body to absorb vitamin B12. These medicines include:  Heartburn medicine (antacids and proton pump inhibitors).  A certain antibiotic medicine called neomycin, which fights infection.  Some medicines used to treat diabetes, tuberculosis, gout, and high cholesterol. RISK FACTORS Risk factors are things that make you more likely to develop a vitamin B12 deficiency. They include:  Being older than 50.  Being a vegetarian.  Being pregnant and a vegetarian or having a poor diet.  Taking certain drugs.  Being an alcoholic. SYMPTOMS You may have a vitamin B12 deficiency with no symptoms. However, a vitamin B12 deficiency can cause health problems like anemia and nerve damage. These health problems can lead to many possible symptoms, including:  Weakness.  Fatigue.  Loss of appetite.  Weight loss.  Numbness or tingling in your hands and feet.  Redness and burning of the  tongue.  Confusion or memory problems.  Depression.  Dizziness.  Sensory problems, such as loss of taste, color blindness, and ringing in the ears.  Diarrhea or constipation.  Trouble walking. DIAGNOSIS Various types of tests can be given to help find the cause of your vitamin B12 deficiency. These tests include:  A complete blood count (CBC). This test gives your caregiver an overall picture of what makes up your blood.  A blood test to measure your B12 level.  A blood test to measure intrinsic factor.  An endoscopy. This procedure uses a thin tube with a camera on the end to look into your stomach or intestines. TREATMENT Treatment for vitamin B12 deficiency depends on what is causing it. Common options include:  Changing your eating and drinking habits, such as:  Eating more foods that contain vitamin B12.  Not drinking as much alcohol or any alcohol.  Taking vitamin B12 supplements. Your caregiver will tell you what dose is best for you.  Getting vitamin B12 injections. Some people get these a few times a week. Others get them once a month. HOME CARE INSTRUCTIONS  Take all supplements as directed by your caregiver. Follow the directions carefully.  Get any injections your caregiver prescribes. Do not miss your appointments.  Eat lots of healthy foods that contain vitamin B12. Ask your caregiver if you should work with a nutritionist. Good things to include in your diet are:  Meat.  Poultry.  Fish.  Eggs.  Fortified cereal and dairy products. This means vitamin B12 has been added to the food. Check the label on the   package to be sure.  Do not abuse alcohol.  Keep all follow-up appointments. Your caregiver will need to perform blood tests to make sure your vitamin B12 deficiency is going away. SEEK MEDICAL CARE IF:  You have any questions about your treatment.  Your symptoms come back. MAKE SURE YOU:  Understand these instructions.  Will watch your  condition.  Will get help right away if you are not doing well or get worse. Document Released: 05/07/2011 Document Reviewed: 05/07/2011 [facility] Patient Information 2013 [employer], [place]. ________________________________________________  Cyanocobalamin, Vitamin B12 injection What is this medicine? CYANOCOBALAMIN (sye an oh koe BAL a min) is a man made form of vitamin B12. Vitamin B12 is used in the growth of healthy blood cells, nerve cells, and proteins in the body. It also helps with the metabolism of fats and carbohydrates. This medicine is used to treat people who can not absorb vitamin B12. This medicine may be used for other purposes; ask your health care provider or pharmacist if you have questions. What should I tell my health care provider before I take this medicine? They need to know if you have any of these conditions: -kidney disease -Leber's disease -megaloblastic anemia -an unusual or allergic reaction to cyanocobalamin, cobalt, other medicines, foods, dyes, or preservatives -pregnant or trying to get pregnant -breast-feeding How should I use this medicine? This medicine is injected into a muscle or deeply under the skin. It is usually given by a health care professional in a clinic or doctor's office. However, your doctor may teach you how to inject yourself. Follow all instructions. Talk to your pediatrician regarding the use of this medicine in children. Special care may be needed. Overdosage: If you think you have taken too much of this medicine contact a poison control center or emergency room at once. NOTE: This medicine is only for you. Do not share this medicine with others. What if I miss a dose? If you are given your dose at a clinic or doctor's office, call to reschedule your appointment. If you give your own injections and you miss a dose, take it as soon as you can. If it is almost time for your next dose, take only that dose. Do not take double or extra  doses. What may interact with this medicine? -colchicine -heavy alcohol intake This list may not describe all possible interactions. Give your health care provider a list of all the medicines, herbs, non-prescription drugs, or dietary supplements you use. Also tell them if you smoke, drink alcohol, or use illegal drugs. Some items may interact with your medicine. What should I watch for while using this medicine? Visit your doctor or health care professional regularly. You may need blood work done while you are taking this medicine. You may need to follow a special diet. Talk to your doctor. Limit your alcohol intake and avoid smoking to get the best benefit. What side effects may I notice from receiving this medicine? Side effects that you should report to your doctor or health care professional as soon as possible: -allergic reactions like skin rash, itching or hives, swelling of the face, lips, or tongue -blue tint to skin -chest tightness, pain -difficulty breathing, wheezing -dizziness -red, swollen painful area on the leg Side effects that usually do not require medical attention (report to your doctor or health care professional if they continue or are bothersome): -diarrhea -headache This list may not describe all possible side effects. Call your doctor for medical advice about side effects. You  may report side effects to FDA at 1-800-FDA-1088. Where should I keep my medicine? Keep out of the reach of children. Store at room temperature between 15 and 30 degrees C (59 and 85 degrees F). Protect from light. Throw away any unused medicine after the expiration date. NOTE: This sheet is a summary. It may not cover all possible information. If you have questions about this medicine, talk to your doctor, pharmacist, or health care provider.  2012, Elsevier/Gold Standard. (05/26/2007 10:10:20 PM)

## 2011-12-20 NOTE — Progress Notes (Signed)
History of Present Illness: This is a complicated 76 year old Caucasian female with chronic GERD and recent endoscopy which documented chronic Barrett's mucosa without evidence of dysplasia. She is doing well on Protonix 40 mg a day. As part of her workup she was found to be 12 deficient, and has been on parenteral replacement therapy. She currently refuses further shots, and says that she has ordered by mouth B12 supplements that she will take by mouth. Currently she denies chest pain, abdominal pain, or dysphagia. Her appetite is good her weight is stable. Review of labs from August of 2012 showed a normal CBC, liver profile, and metabolic profile. Patient is status post fundoplication. Recent gastric biopsies were negative for H. pylori infection.    Current Medications, Allergies, Past Medical History, Past Surgical History, Family History and Social History were reviewed in [employer] record.   Assessment and plan: Continue daily PPI therapy and antireflux regime with every three-year endoscopy and biopsies unless otherwise indicated. I tried at length and explained B12 issues with this patient, but I do not think she will follow my recommendations. She is up-to-date her colonoscopy exams. She is to continue other medications as per primary care. Colonoscopy was negative in March of this year. No diagnosis found.

## 2012-01-14 ENCOUNTER — Ambulatory Visit (INDEPENDENT_AMBULATORY_CARE_PROVIDER_SITE_OTHER): Payer: PRIVATE HEALTH INSURANCE | Admitting: Gastroenterology

## 2012-01-14 DIAGNOSIS — E538 Deficiency of other specified B group vitamins: Secondary | ICD-10-CM

## 2012-01-14 MED ORDER — CYANOCOBALAMIN 1000 MCG/ML IJ SOLN
1000.0000 ug | Freq: Once | INTRAMUSCULAR | Status: AC
  Administered 2012-01-14: 1000 ug via INTRAMUSCULAR

## 2012-03-07 ENCOUNTER — Ambulatory Visit (INDEPENDENT_AMBULATORY_CARE_PROVIDER_SITE_OTHER): Payer: PRIVATE HEALTH INSURANCE | Admitting: Gastroenterology

## 2012-03-07 DIAGNOSIS — E538 Deficiency of other specified B group vitamins: Secondary | ICD-10-CM

## 2012-03-07 MED ORDER — CYANOCOBALAMIN 1000 MCG/ML IJ SOLN
1000.0000 ug | INTRAMUSCULAR | Status: AC
  Administered 2012-03-07 – 2012-04-08 (×2): 1000 ug via INTRAMUSCULAR

## 2012-04-08 ENCOUNTER — Ambulatory Visit (INDEPENDENT_AMBULATORY_CARE_PROVIDER_SITE_OTHER): Payer: PRIVATE HEALTH INSURANCE | Admitting: Gastroenterology

## 2012-04-08 DIAGNOSIS — E538 Deficiency of other specified B group vitamins: Secondary | ICD-10-CM

## 2012-04-23 ENCOUNTER — Telehealth: Payer: Self-pay | Admitting: Gastroenterology

## 2012-04-23 DIAGNOSIS — E538 Deficiency of other specified B group vitamins: Secondary | ICD-10-CM

## 2012-04-23 NOTE — Telephone Encounter (Signed)
Pt has been taking Vit B12 injections since 10/12/10 ; pt wants to know if she has to stay on the injections for the rest of her life? We discussed absorption and age issues. Since we don't have any in stock, pt would like a B12 level to see if she can stop injections for a while? Thanks.

## 2012-04-23 NOTE — Telephone Encounter (Signed)
Lab, B12 entered and pt will come at her convenience to have drawn.

## 2012-04-23 NOTE — Telephone Encounter (Signed)
ok 

## 2012-04-24 ENCOUNTER — Other Ambulatory Visit (INDEPENDENT_AMBULATORY_CARE_PROVIDER_SITE_OTHER): Payer: PRIVATE HEALTH INSURANCE

## 2012-04-24 DIAGNOSIS — E538 Deficiency of other specified B group vitamins: Secondary | ICD-10-CM

## 2012-04-24 LAB — VITAMIN B12: Vitamin B-12: 554 pg/mL (ref 211–911)

## 2013-03-18 ENCOUNTER — Encounter: Payer: Self-pay | Admitting: Gastroenterology

## 2014-12-17 ENCOUNTER — Encounter: Payer: Self-pay | Admitting: Gastroenterology

## 2015-06-09 ENCOUNTER — Encounter: Payer: Self-pay | Admitting: Gastroenterology

## 2015-06-29 NOTE — H&P (Signed)
  Samantha Ayala is an 80 y.o. female.    Chief Complaint: left knee pain  HPI: Pt is a 81 y.o. female complaining of left knee pain for multiple years. Pain had continually increased since the beginning. X-rays in the clinic show end-stage arthritic changes of the left knee. Pt has tried various conservative treatments which have failed to alleviate their symptoms, including injections and therapy. Various options are discussed with the patient. Risks, benefits and expectations were discussed with the patient. Patient understand the risks, benefits and expectations and wishes to proceed with surgery.   PCP:  Leonard Downing, MD  D/C Plans: Home  PMH: Past Medical History  Diagnosis Date  . Stricture and stenosis of esophagus   . Unspecified hemorrhoids without mention of complication   . Angiodysplasia of intestine with hemorrhage   . Esophageal reflux   . Hiatal hernia   . Barrett esophagus   . Gastric polyp 06/20/2006    HYPERPLASTIC POLYP  . Personal history of colonic polyps   . Arthritis   . Osteoporosis   . GERD (gastroesophageal reflux disease)   . 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 Nissen fundoplication  1638  . Cataract extraction  2010    bilateral  . Toe amputation  2006    Social History:  reports that she has never smoked. She has never used smokeless tobacco. She reports that she does not drink alcohol or use illicit drugs.  Allergies:  No Known Allergies  Medications: No current facility-administered medications for this encounter.   Current Outpatient Prescriptions  Medication Sig Dispense Refill  . alendronate (FOSAMAX) 70 MG tablet Take 70 mg by mouth every 7 (seven) days. Take with a full glass of water on an empty stomach.     Marland Kitchen aspirin 81 MG tablet Take 81 mg by mouth daily.    . calcium-vitamin D (OSCAL WITH D) 500-200 MG-UNIT per tablet Take 1 tablet by mouth  daily.      . furosemide (LASIX) 40 MG tablet Take 40 mg by mouth daily.      . naproxen sodium (ANAPROX) 220 MG tablet Take 220 mg by mouth daily.      . pantoprazole (PROTONIX) 40 MG tablet Take 1 tablet (40 mg total) by mouth daily. 30 tablet 11  . peg 3350 powder (MOVIPREP) 100 G SOLR moviprep as directed 1 kit 0    No results found for this or any previous visit (from the past 48 hour(s)). No results found.  ROS: Pain with rom of the left lower extremity  Physical Exam:  Alert and oriented 80 y.o. female in no acute distress Cranial nerves 2-12 intact Cervical spine: full rom with no tenderness, nv intact distally Chest: active breath sounds bilaterally, no wheeze rhonchi or rales Heart: regular rate and rhythm, no murmur Abd: non tender non distended with active bowel sounds Hip is stable with rom  Left knee with moderate crepitus with rom nv intact distally Antalgic gait  Assessment/Plan Assessment: left knee end stage osteoarthritis   Plan: Patient will undergo a left total knee arthroplasty by Dr. Veverly Fells at [facility]. Risks benefits and expectations were discussed with the patient. Patient understand risks, benefits and expectations and wishes to proceed.

## 2015-07-01 ENCOUNTER — Other Ambulatory Visit (HOSPITAL_COMMUNITY): Payer: Self-pay | Admitting: *Deleted

## 2015-07-01 NOTE — Pre-Procedure Instructions (Signed)
    Samantha Ayala  07/01/2015      [facility] - [place] ([place]), [place] [id number] [place] ([place]) [place] [address] Phone: [phone] Fax: [phone]  [place], [place] [address] [place] [address] [place] [address] Phone: [phone] Fax: [phone]    Your procedure is scheduled on 07-15-2015  Friday   Report to [facility] Admitting at 9:30 A.M.   Call this number if you have problems the morning of surgery:  [phone]   Remember:  Do not eat food or drink liquids after midnight.   Take these medicines the morning of surgery with A SIP OF WATER Protonix,Tramadol(Ultram) if needed              1 week prior to surgery stop: NSAIDS: advil, ibuprofen, aleve, Goody's, BC'S, vitamins/herbal medicines    Do not wear jewelry, make-up or nail polish.  Do not wear lotions, powders, or perfumes.  You may not wear deodorant.   Do not shave 48 hours prior to surgery.     Do not bring valuables to the hospital.  [facility] is not responsible for any belongings or valuables.  Contacts, dentures or bridgework may not be worn into surgery.  Leave your suitcase in the car.  After surgery it may be brought to your room.  For patients admitted to the hospital, discharge time will be determined by your treatment team.  Patients discharged the day of surgery will not be allowed to drive home.    Special instructions:  See attached instructions for CHG showers  Please read over the following fact sheets that you were given. Pain Booklet, Coughing and Deep Breathing and Surgical Site Infection Prevention

## 2015-07-04 ENCOUNTER — Encounter (HOSPITAL_COMMUNITY)
Admission: RE | Admit: 2015-07-04 | Discharge: 2015-07-04 | Disposition: A | Discharge status: Home / Self Care | Payer: Medicare Other | Source: Ambulatory Visit | Attending: Orthopedic Surgery | Admitting: Orthopedic Surgery

## 2015-07-04 ENCOUNTER — Encounter (HOSPITAL_COMMUNITY): Payer: Self-pay

## 2015-07-04 DIAGNOSIS — M1712 Unilateral primary osteoarthritis, left knee: Secondary | ICD-10-CM | POA: Insufficient documentation

## 2015-07-04 DIAGNOSIS — Z01812 Encounter for preprocedural laboratory examination: Secondary | ICD-10-CM | POA: Diagnosis not present

## 2015-07-04 LAB — BASIC METABOLIC PANEL
ANION GAP: 9 (ref 5–15)
BUN: 10 mg/dL (ref 6–20)
CALCIUM: 9.2 mg/dL (ref 8.9–10.3)
CO2: 30 mmol/L (ref 22–32)
CREATININE: 0.66 mg/dL (ref 0.44–1.00)
Chloride: 103 mmol/L (ref 101–111)
Glucose, Bld: 102 mg/dL — ABNORMAL HIGH (ref 65–99)
Potassium: 3.3 mmol/L — ABNORMAL LOW (ref 3.5–5.1)
SODIUM: 142 mmol/L (ref 135–145)

## 2015-07-04 LAB — CBC
HCT: 41.6 % (ref 36.0–46.0)
HEMOGLOBIN: 13.5 g/dL (ref 12.0–15.0)
MCH: 28.2 pg (ref 26.0–34.0)
MCHC: 32.5 g/dL (ref 30.0–36.0)
MCV: 87 fL (ref 78.0–100.0)
PLATELETS: 202 10*3/uL (ref 150–400)
RBC: 4.78 MIL/uL (ref 3.87–5.11)
RDW: 13.4 % (ref 11.5–15.5)
WBC: 6.4 10*3/uL (ref 4.0–10.5)

## 2015-07-04 LAB — SURGICAL PCR SCREEN
MRSA, PCR: NEGATIVE
Staphylococcus aureus: NEGATIVE

## 2015-07-04 NOTE — Progress Notes (Signed)
PCP:Dr.Elkin in [place]- pt. Stated she had ekg/cxr- will request

## 2015-07-14 MED ORDER — CEFAZOLIN SODIUM-DEXTROSE 2-4 GM/100ML-% IV SOLN
2.0000 g | INTRAVENOUS | Status: AC
  Administered 2015-07-15: 2 g via INTRAVENOUS
  Filled 2015-07-14: qty 100

## 2015-07-15 ENCOUNTER — Inpatient Hospital Stay (HOSPITAL_COMMUNITY): Payer: Medicare Other

## 2015-07-15 ENCOUNTER — Encounter (HOSPITAL_COMMUNITY): Payer: Self-pay | Admitting: Anesthesiology

## 2015-07-15 ENCOUNTER — Inpatient Hospital Stay (HOSPITAL_COMMUNITY): Payer: Medicare Other | Admitting: Anesthesiology

## 2015-07-15 ENCOUNTER — Inpatient Hospital Stay (HOSPITAL_COMMUNITY)
Admission: RE | Admit: 2015-07-15 | Discharge: 2015-07-18 | DRG: 470 | Disposition: A | Discharge status: Skilled Nursing Facility | Payer: Medicare Other | Source: Ambulatory Visit | Attending: Orthopedic Surgery | Admitting: Orthopedic Surgery

## 2015-07-15 ENCOUNTER — Encounter (HOSPITAL_COMMUNITY)
Admission: RE | Disposition: A | Discharge status: Skilled Nursing Facility | Payer: Self-pay | Source: Ambulatory Visit | Attending: Orthopedic Surgery

## 2015-07-15 DIAGNOSIS — M1712 Unilateral primary osteoarthritis, left knee: Principal | ICD-10-CM | POA: Diagnosis present

## 2015-07-15 DIAGNOSIS — Z96659 Presence of unspecified artificial knee joint: Secondary | ICD-10-CM

## 2015-07-15 DIAGNOSIS — M810 Age-related osteoporosis without current pathological fracture: Secondary | ICD-10-CM | POA: Diagnosis present

## 2015-07-15 DIAGNOSIS — Z7983 Long term (current) use of bisphosphonates: Secondary | ICD-10-CM

## 2015-07-15 DIAGNOSIS — Z7982 Long term (current) use of aspirin: Secondary | ICD-10-CM

## 2015-07-15 DIAGNOSIS — Z9841 Cataract extraction status, right eye: Secondary | ICD-10-CM

## 2015-07-15 DIAGNOSIS — K449 Diaphragmatic hernia without obstruction or gangrene: Secondary | ICD-10-CM | POA: Diagnosis present

## 2015-07-15 DIAGNOSIS — Z9842 Cataract extraction status, left eye: Secondary | ICD-10-CM

## 2015-07-15 DIAGNOSIS — K219 Gastro-esophageal reflux disease without esophagitis: Secondary | ICD-10-CM | POA: Diagnosis present

## 2015-07-15 DIAGNOSIS — M25562 Pain in left knee: Secondary | ICD-10-CM | POA: Diagnosis present

## 2015-07-15 DIAGNOSIS — Z96652 Presence of left artificial knee joint: Secondary | ICD-10-CM

## 2015-07-15 HISTORY — PX: TOTAL KNEE ARTHROPLASTY: SHX125

## 2015-07-15 LAB — CBC
HCT: 37.9 % (ref 36.0–46.0)
HEMOGLOBIN: 11.9 g/dL — AB (ref 12.0–15.0)
MCH: 27.3 pg (ref 26.0–34.0)
MCHC: 31.4 g/dL (ref 30.0–36.0)
MCV: 86.9 fL (ref 78.0–100.0)
Platelets: 186 10*3/uL (ref 150–400)
RBC: 4.36 MIL/uL (ref 3.87–5.11)
RDW: 13.8 % (ref 11.5–15.5)
WBC: 8.6 10*3/uL (ref 4.0–10.5)

## 2015-07-15 LAB — CREATININE, SERUM
CREATININE: 0.69 mg/dL (ref 0.44–1.00)
GFR calc Af Amer: >60 mL/min (ref >60)

## 2015-07-15 SURGERY — ARTHROPLASTY, KNEE, TOTAL
Anesthesia: Monitor Anesthesia Care | Site: Knee | Laterality: Left

## 2015-07-15 MED ORDER — SODIUM CHLORIDE 0.9 % IV SOLN
INTRAVENOUS | Status: DC
  Administered 2015-07-15: 18:00:00 via INTRAVENOUS

## 2015-07-15 MED ORDER — CHLORHEXIDINE GLUCONATE 4 % EX LIQD
60.0000 mL | Freq: Once | CUTANEOUS | Status: DC
Start: 2015-07-15 — End: 2015-07-15

## 2015-07-15 MED ORDER — COUMADIN BOOK
Freq: Once | Status: DC
  Filled 2015-07-15: qty 1

## 2015-07-15 MED ORDER — FENTANYL CITRATE (PF) 100 MCG/2ML IJ SOLN
INTRAMUSCULAR | Status: AC
  Administered 2015-07-15: 50 ug
  Filled 2015-07-15: qty 2

## 2015-07-15 MED ORDER — DOCUSATE SODIUM 100 MG PO CAPS
100.0000 mg | ORAL_CAPSULE | Freq: Two times a day (BID) | ORAL | Status: DC
  Administered 2015-07-15 – 2015-07-18 (×6): 100 mg via ORAL
  Filled 2015-07-15 (×6): qty 1

## 2015-07-15 MED ORDER — WARFARIN SODIUM 5 MG PO TABS: 5.0000 mg | ORAL_TABLET | Freq: Every day | ORAL | Status: DC

## 2015-07-15 MED ORDER — CEFAZOLIN SODIUM-DEXTROSE 2-4 GM/100ML-% IV SOLN
2.0000 g | Freq: Four times a day (QID) | INTRAVENOUS | Status: AC
  Administered 2015-07-15 – 2015-07-16 (×2): 2 g via INTRAVENOUS
  Filled 2015-07-15 (×2): qty 100

## 2015-07-15 MED ORDER — PROPOFOL 500 MG/50ML IV EMUL
INTRAVENOUS | Status: DC | PRN
  Administered 2015-07-15: 50 ug/kg/min via INTRAVENOUS

## 2015-07-15 MED ORDER — LACTATED RINGERS IV SOLN
INTRAVENOUS | Status: DC
  Administered 2015-07-15: 09:00:00 via INTRAVENOUS

## 2015-07-15 MED ORDER — ACETAMINOPHEN 650 MG RE SUPP: 650.0000 mg | Freq: Four times a day (QID) | RECTAL | Status: DC | PRN

## 2015-07-15 MED ORDER — PANTOPRAZOLE SODIUM 40 MG PO TBEC
40.0000 mg | DELAYED_RELEASE_TABLET | Freq: Every day | ORAL | Status: DC
  Administered 2015-07-15 – 2015-07-18 (×4): 40 mg via ORAL
  Filled 2015-07-15 (×4): qty 1

## 2015-07-15 MED ORDER — MENTHOL 3 MG MT LOZG: 1.0000 | LOZENGE | OROMUCOSAL | Status: DC | PRN

## 2015-07-15 MED ORDER — FENTANYL CITRATE (PF) 100 MCG/2ML IJ SOLN
INTRAMUSCULAR | Status: AC
  Filled 2015-07-15: qty 2

## 2015-07-15 MED ORDER — PHENYLEPHRINE HCL 10 MG/ML IJ SOLN
10.0000 mg | INTRAMUSCULAR | Status: DC | PRN
  Administered 2015-07-15: 25 ug/min via INTRAVENOUS

## 2015-07-15 MED ORDER — CALCIUM CARBONATE-VITAMIN D 500-200 MG-UNIT PO TABS
1.0000 | ORAL_TABLET | Freq: Every day | ORAL | Status: DC
  Administered 2015-07-15 – 2015-07-18 (×4): 1 via ORAL
  Filled 2015-07-15 (×4): qty 1

## 2015-07-15 MED ORDER — METHOCARBAMOL 1000 MG/10ML IJ SOLN
500.0000 mg | Freq: Four times a day (QID) | INTRAVENOUS | Status: DC | PRN
  Filled 2015-07-15: qty 5

## 2015-07-15 MED ORDER — MORPHINE SULFATE (PF) 2 MG/ML IV SOLN
2.0000 mg | INTRAVENOUS | Status: DC | PRN
  Administered 2015-07-15 – 2015-07-17 (×4): 2 mg via INTRAVENOUS
  Filled 2015-07-15 (×4): qty 1

## 2015-07-15 MED ORDER — HYDROCODONE-ACETAMINOPHEN 5-325 MG PO TABS
1.0000 | ORAL_TABLET | ORAL | Status: DC | PRN
  Administered 2015-07-15 – 2015-07-18 (×12): 2 via ORAL
  Filled 2015-07-15 (×12): qty 2

## 2015-07-15 MED ORDER — ONDANSETRON HCL 4 MG/2ML IJ SOLN
INTRAMUSCULAR | Status: AC
Start: 2015-07-15 — End: 2015-07-15
  Filled 2015-07-15: qty 2

## 2015-07-15 MED ORDER — PHENOL 1.4 % MT LIQD: 1.0000 | OROMUCOSAL | Status: DC | PRN

## 2015-07-15 MED ORDER — ONDANSETRON HCL 4 MG/2ML IJ SOLN
INTRAMUSCULAR | Status: DC | PRN
  Administered 2015-07-15: 4 mg via INTRAVENOUS

## 2015-07-15 MED ORDER — FUROSEMIDE 40 MG PO TABS
40.0000 mg | ORAL_TABLET | Freq: Every day | ORAL | Status: DC
  Administered 2015-07-15 – 2015-07-18 (×4): 40 mg via ORAL
  Filled 2015-07-15 (×4): qty 1

## 2015-07-15 MED ORDER — TRAMADOL HCL 50 MG PO TABS: 50.0000 mg | ORAL_TABLET | Freq: Four times a day (QID) | ORAL | Status: DC | PRN

## 2015-07-15 MED ORDER — WARFARIN - PHARMACIST DOSING INPATIENT
Freq: Every day | Status: DC
  Administered 2015-07-17: 18:00:00

## 2015-07-15 MED ORDER — WARFARIN SODIUM 5 MG PO TABS
5.0000 mg | ORAL_TABLET | Freq: Once | ORAL | Status: AC
  Administered 2015-07-15: 5 mg via ORAL
  Filled 2015-07-15: qty 1

## 2015-07-15 MED ORDER — METHOCARBAMOL 500 MG PO TABS
500.0000 mg | ORAL_TABLET | Freq: Four times a day (QID) | ORAL | Status: DC | PRN
  Administered 2015-07-16 (×3): 500 mg via ORAL
  Filled 2015-07-15 (×3): qty 1

## 2015-07-15 MED ORDER — 0.9 % SODIUM CHLORIDE (POUR BTL) OPTIME
TOPICAL | Status: DC | PRN
  Administered 2015-07-15: 1000 mL

## 2015-07-15 MED ORDER — METOCLOPRAMIDE HCL 5 MG/ML IJ SOLN: 5.0000 mg | Freq: Three times a day (TID) | INTRAMUSCULAR | Status: DC | PRN

## 2015-07-15 MED ORDER — ONDANSETRON HCL 4 MG PO TABS: 4.0000 mg | ORAL_TABLET | Freq: Four times a day (QID) | ORAL | Status: DC | PRN

## 2015-07-15 MED ORDER — BUPIVACAINE IN DEXTROSE 0.75-8.25 % IT SOLN
INTRATHECAL | Status: DC | PRN
Start: 2015-07-15 — End: 2015-07-15
  Administered 2015-07-15: 2 mL via INTRATHECAL

## 2015-07-15 MED ORDER — FENTANYL CITRATE (PF) 100 MCG/2ML IJ SOLN
50.0000 ug | Freq: Once | INTRAMUSCULAR | Status: DC
  Filled 2015-07-15: qty 2

## 2015-07-15 MED ORDER — MIDAZOLAM HCL 2 MG/2ML IJ SOLN
INTRAMUSCULAR | Status: AC
  Filled 2015-07-15: qty 2

## 2015-07-15 MED ORDER — FERROUS SULFATE 325 (65 FE) MG PO TABS
325.0000 mg | ORAL_TABLET | Freq: Three times a day (TID) | ORAL | Status: DC
  Administered 2015-07-15 – 2015-07-18 (×9): 325 mg via ORAL
  Filled 2015-07-15 (×9): qty 1

## 2015-07-15 MED ORDER — WARFARIN VIDEO
Freq: Once | Status: AC
  Administered 2015-07-16: 14:00:00

## 2015-07-15 MED ORDER — POLYETHYLENE GLYCOL 3350 17 G PO PACK: 17.0000 g | PACK | Freq: Every day | ORAL | Status: DC | PRN

## 2015-07-15 MED ORDER — ACETAMINOPHEN 325 MG PO TABS
650.0000 mg | ORAL_TABLET | Freq: Four times a day (QID) | ORAL | Status: DC | PRN
  Filled 2015-07-15: qty 2

## 2015-07-15 MED ORDER — METHOCARBAMOL 500 MG PO TABS: 500.0000 mg | ORAL_TABLET | Freq: Three times a day (TID) | ORAL | Status: DC | PRN

## 2015-07-15 MED ORDER — TRANEXAMIC ACID 1000 MG/10ML IV SOLN
1000.0000 mg | INTRAVENOUS | Status: AC
  Administered 2015-07-15: 1000 mg via INTRAVENOUS
  Filled 2015-07-15: qty 10

## 2015-07-15 MED ORDER — HYDROCODONE-ACETAMINOPHEN 5-325 MG PO TABS: 1.0000 | ORAL_TABLET | Freq: Four times a day (QID) | ORAL | Status: DC | PRN

## 2015-07-15 MED ORDER — ENOXAPARIN SODIUM 30 MG/0.3ML ~~LOC~~ SOLN
30.0000 mg | Freq: Two times a day (BID) | SUBCUTANEOUS | Status: DC
  Administered 2015-07-16 – 2015-07-18 (×5): 30 mg via SUBCUTANEOUS
  Filled 2015-07-15 (×5): qty 0.3

## 2015-07-15 MED ORDER — BUPIVACAINE-EPINEPHRINE (PF) 0.5% -1:200000 IJ SOLN
INTRAMUSCULAR | Status: DC | PRN
  Administered 2015-07-15: 25 mL via PERINEURAL

## 2015-07-15 MED ORDER — METOCLOPRAMIDE HCL 5 MG PO TABS: 5.0000 mg | ORAL_TABLET | Freq: Three times a day (TID) | ORAL | Status: DC | PRN

## 2015-07-15 MED ORDER — FENTANYL CITRATE (PF) 250 MCG/5ML IJ SOLN
INTRAMUSCULAR | Status: AC
  Filled 2015-07-15: qty 5

## 2015-07-15 MED ORDER — BISACODYL 10 MG RE SUPP: 10.0000 mg | Freq: Every day | RECTAL | Status: DC | PRN

## 2015-07-15 MED ORDER — FENTANYL CITRATE (PF) 100 MCG/2ML IJ SOLN
25.0000 ug | INTRAMUSCULAR | Status: DC | PRN
  Administered 2015-07-15 (×4): 25 ug via INTRAVENOUS

## 2015-07-15 MED ORDER — SODIUM CHLORIDE 0.9 % IR SOLN
Status: DC | PRN
  Administered 2015-07-15 (×2): 1000 mL

## 2015-07-15 MED ORDER — ONDANSETRON HCL 4 MG/2ML IJ SOLN: 4.0000 mg | Freq: Four times a day (QID) | INTRAMUSCULAR | Status: DC | PRN

## 2015-07-15 MED ORDER — ALENDRONATE SODIUM 70 MG PO TABS: 70.0000 mg | ORAL_TABLET | ORAL | Status: DC

## 2015-07-15 SURGICAL SUPPLY — 62 items
BANDAGE ESMARK 6X9 LF (GAUZE/BANDAGES/DRESSINGS) ×1 IMPLANT
BLADE SAG 18X100X1.27 (BLADE) ×3 IMPLANT
BLADE SAW SGTL 13X75X1.27 (BLADE) ×3 IMPLANT
BLADE SAW SGTL 18X1.27X75 (BLADE) ×2 IMPLANT
BLADE SAW SGTL 18X1.27X75MM (BLADE) ×1
BNDG CMPR 9X6 STRL LF SNTH (GAUZE/BANDAGES/DRESSINGS) ×1
BNDG CMPR MED 10X6 ELC LF (GAUZE/BANDAGES/DRESSINGS) ×1
BNDG ELASTIC 6X10 VLCR STRL LF (GAUZE/BANDAGES/DRESSINGS) ×3 IMPLANT
BNDG ESMARK 6X9 LF (GAUZE/BANDAGES/DRESSINGS) ×3
BNDG GAUZE ELAST 4 BULKY (GAUZE/BANDAGES/DRESSINGS) ×6 IMPLANT
BOWL SMART MIX CTS (DISPOSABLE) ×3 IMPLANT
CAPT KNEE TOTAL 3 ATTUNE ×2 IMPLANT
CEMENT HV SMART SET (Cement) ×6 IMPLANT
CLOSURE WOUND 1/2 X4 (GAUZE/BANDAGES/DRESSINGS) ×2
COVER SURGICAL LIGHT HANDLE (MISCELLANEOUS) ×3 IMPLANT
CUFF TOURNIQUET SINGLE 34IN LL (TOURNIQUET CUFF) IMPLANT
CUFF TOURNIQUET SINGLE 44IN (TOURNIQUET CUFF) IMPLANT
DRAPE EXTREMITY T 121X128X90 (DRAPE) ×3 IMPLANT
DRAPE IMP U-DRAPE 54X76 (DRAPES) ×3 IMPLANT
DRAPE PROXIMA HALF (DRAPES) ×3 IMPLANT
DRAPE U-SHAPE 47X51 STRL (DRAPES) ×3 IMPLANT
DRSG ADAPTIC 3X8 NADH LF (GAUZE/BANDAGES/DRESSINGS) ×3 IMPLANT
DRSG PAD ABDOMINAL 8X10 ST (GAUZE/BANDAGES/DRESSINGS) ×4 IMPLANT
DURAPREP 26ML APPLICATOR (WOUND CARE) ×3 IMPLANT
ELECT CAUTERY BLADE 6.4 (BLADE) ×3 IMPLANT
ELECT REM PT RETURN 9FT ADLT (ELECTROSURGICAL) ×3
ELECTRODE REM PT RTRN 9FT ADLT (ELECTROSURGICAL) ×1 IMPLANT
GAUZE SPONGE 4X4 12PLY STRL (GAUZE/BANDAGES/DRESSINGS) ×3 IMPLANT
GLOVE BIOGEL PI ORTHO PRO 7.5 (GLOVE) ×2
GLOVE BIOGEL PI ORTHO PRO SZ8 (GLOVE) ×2
GLOVE ORTHO TXT STRL SZ7.5 (GLOVE) ×3 IMPLANT
GLOVE PI ORTHO PRO STRL 7.5 (GLOVE) ×1 IMPLANT
GLOVE PI ORTHO PRO STRL SZ8 (GLOVE) ×1 IMPLANT
GLOVE SURG ORTHO 8.5 STRL (GLOVE) ×3 IMPLANT
GOWN STRL REUS W/ TWL XL LVL3 (GOWN DISPOSABLE) ×3 IMPLANT
GOWN STRL REUS W/TWL XL LVL3 (GOWN DISPOSABLE) ×9
HANDPIECE INTERPULSE COAX TIP (DISPOSABLE) ×3
IMMOBILIZER KNEE 22 UNIV (SOFTGOODS) ×2 IMPLANT
KIT BASIN OR (CUSTOM PROCEDURE TRAY) ×3 IMPLANT
KIT MANIFOLD (MISCELLANEOUS) ×3 IMPLANT
KIT ROOM TURNOVER OR (KITS) ×3 IMPLANT
MANIFOLD NEPTUNE II (INSTRUMENTS) ×3 IMPLANT
NS IRRIG 1000ML POUR BTL (IV SOLUTION) ×3 IMPLANT
PACK TOTAL JOINT (CUSTOM PROCEDURE TRAY) ×3 IMPLANT
PACK UNIVERSAL I (CUSTOM PROCEDURE TRAY) ×3 IMPLANT
PAD ARMBOARD 7.5X6 YLW CONV (MISCELLANEOUS) ×6 IMPLANT
SET HNDPC FAN SPRY TIP SCT (DISPOSABLE) ×1 IMPLANT
SPONGE GAUZE 4X4 12PLY STER LF (GAUZE/BANDAGES/DRESSINGS) ×2 IMPLANT
STRIP CLOSURE SKIN 1/2X4 (GAUZE/BANDAGES/DRESSINGS) ×4 IMPLANT
SUCTION FRAZIER HANDLE 10FR (MISCELLANEOUS) ×2
SUCTION TUBE FRAZIER 10FR DISP (MISCELLANEOUS) ×1 IMPLANT
SUT MNCRL AB 3-0 PS2 18 (SUTURE) ×3 IMPLANT
SUT VIC AB 0 CT1 27 (SUTURE) ×6
SUT VIC AB 0 CT1 27XBRD ANBCTR (SUTURE) ×2 IMPLANT
SUT VIC AB 1 CT1 27 (SUTURE) ×9
SUT VIC AB 1 CT1 27XBRD ANBCTR (SUTURE) ×3 IMPLANT
SUT VIC AB 2-0 CT1 27 (SUTURE) ×6
SUT VIC AB 2-0 CT1 TAPERPNT 27 (SUTURE) ×2 IMPLANT
TOWEL OR 17X24 6PK STRL BLUE (TOWEL DISPOSABLE) ×3 IMPLANT
TOWEL OR 17X26 10 PK STRL BLUE (TOWEL DISPOSABLE) ×3 IMPLANT
TRAY FOLEY CATH 16FRSI W/METER (SET/KITS/TRAYS/PACK) IMPLANT
WATER STERILE IRR 1000ML POUR (IV SOLUTION) ×6 IMPLANT

## 2015-07-15 NOTE — Progress Notes (Addendum)
ANTICOAGULATION CONSULT NOTE - Initial Consult  Pharmacy Consult for warfarin Indication: VTE prophylaxis  No Known Allergies  Patient Measurements: Height: 5\' 8"  (172.7 cm) Weight: 196 lb (88.905 kg) IBW/kg (Calculated) : 63.9  Vital Signs: Temp: 98.5 F (36.9 C) (05/19 1629) Temp Source: Oral (05/19 1629) BP: 127/68 mmHg (05/19 1629) Pulse Rate: 94 (05/19 1629)  Labs: No results for input(s): HGB, HCT, PLT, APTT, LABPROT, INR, HEPARINUNFRC, HEPRLOWMOCWT, CREATININE, CKTOTAL, CKMB, TROPONINI in the last 72 hours.  Estimated Creatinine Clearance: 67.6 mL/min (by C-G formula based on Cr of 0.66).   Medical History: Past Medical History  Diagnosis Date  . Stricture and stenosis of esophagus   . Unspecified hemorrhoids without mention of complication   . Angiodysplasia of intestine with hemorrhage   . Esophageal reflux   . Hiatal hernia   . Barrett esophagus   . Gastric polyp 06/20/2006    HYPERPLASTIC POLYP  . Personal history of colonic polyps   . Arthritis   . Osteoporosis   . GERD (gastroesophageal reflux disease)   . 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 alendronate (FOSAMAX) 70 MG tablet Take 70 mg by mouth every 7 (seven) days. Take with a full glass of water on an empty stomach.    Past Week at Unknown time  . calcium-vitamin D (OSCAL WITH D) 500-200 MG-UNIT per tablet Take 1 tablet by mouth daily.     Past Week at Unknown time  . furosemide (LASIX) 40 MG tablet Take 40 mg by mouth daily.     07/14/2015 at Unknown time  . pantoprazole (PROTONIX) 40 MG tablet Take 1 tablet (40 mg total) by mouth daily. 30 tablet 11 07/14/2015 at Unknown time  . PRESCRIPTION MEDICATION Take 1 tablet by mouth at bedtime. B12 med. Pt states it is 20 mg   Past Month at Unknown time  . traMADol (ULTRAM) 50 MG tablet Take 50 mg by mouth every 6 (six) hours as needed for  moderate pain.   Past Week at Unknown time    Assessment: 80 y/o female to begin warfarin for VTE prophylaxis s/p left TKA. Baseline INR not available. H/H 13.5/41.6, platelets 202 on 5/8. Also to begin enoxaparin 30 mg SQ q12h tomorrow morning until INR >= 1.8.  Goal of Therapy:  INR 2-3 Monitor platelets by anticoagulation protocol: Yes   Plan:  - Warfarin 5 mg PO tonight - INR daily - Warfarin book and video - F/U d/c enoxaparin   [facility], [place].D., BCPS Clinical Pharmacist Pager: [phone] 07/15/2015 4:40 PM

## 2015-07-15 NOTE — Progress Notes (Signed)
Utilization review completed.  

## 2015-07-15 NOTE — Interval H&P Note (Signed)
History and Physical Interval Note:  07/15/2015 10:50 AM  Samantha Ayala  has presented today for surgery, with the diagnosis of left knee osteoarthritis  The various methods of treatment have been discussed with the patient and family. After consideration of risks, benefits and other options for treatment, the patient has consented to  Procedure(s): LEFT TOTAL KNEE ARTHROPLASTY (Left) as a surgical intervention .  The patient's history has been reviewed, patient examined, no change in status, stable for surgery.  I have reviewed the patient's chart and labs.  Questions were answered to the patient's satisfaction.     Reet Scharrer,STEVEN R

## 2015-07-15 NOTE — Anesthesia Procedure Notes (Addendum)
Anesthesia Regional Block:  Femoral nerve block  Pre-Anesthetic Checklist: ,, timeout performed, Correct Patient, Correct Site, Correct Laterality, Correct Procedure, Correct Position, site marked, Risks and benefits discussed,  Surgical consent,  Pre-op evaluation,  At surgeon's request and post-op pain management  Laterality: Left  Prep: chloraprep       Needles:  Injection technique: Single-shot  Needle Type: Echogenic Stimulator Needle     Needle Length: 9cm 9 cm Needle Gauge: 21 and 21 G    Additional Needles:  Procedures: ultrasound guided (picture in chart) Femoral nerve block Narrative:  Injection made incrementally with aspirations every 5 mL.  Performed by: Personally  Anesthesiologist: Catalina Gravel  Additional Notes: Pt. tolerated procedure well. Good perineural spread visualized on [place].   Spinal Patient location during procedure: OR Staffing Anesthesiologist: Catalina Gravel Performed by: anesthesiologist  Preanesthetic Checklist Completed: patient identified, surgical consent, pre-op evaluation, timeout performed, IV checked, risks and benefits discussed and monitors and equipment checked Spinal Block Patient position: sitting Prep: site prepped and draped and DuraPrep Patient monitoring: continuous pulse ox and blood pressure Approach: midline Location: L3-4 Needle Needle type: Pencan  Needle gauge: 24 G Needle length: 9 cm Additional Notes Functioning IV was confirmed and monitors were applied. Sterile prep and drape, including hand hygiene, mask and sterile gloves were used. The patient was positioned and the spine was prepped. The skin was anesthetized with lidocaine.  Free flow of clear CSF was obtained prior to injecting local anesthetic into the CSF.  The spinal needle aspirated freely following injection.  The needle was carefully withdrawn.  The patient tolerated the procedure well. Consent was obtained prior to procedure with all  questions answered and concerns addressed. Risks including but not limited to bleeding, infection, nerve damage, paralysis, failed block, inadequate analgesia, allergic reaction, high spinal, itching and headache were discussed and the patient wished to proceed.   Hoy Morn, MD  Procedure Name: [facility] Date/Time: 07/15/2015 11:03 AM Performed by: Kyung Rudd Pre-anesthesia Checklist: Patient identified, Emergency Drugs available, Suction available, Patient being monitored and Timeout performed Patient Re-evaluated:Patient Re-evaluated prior to inductionOxygen Delivery Method: Simple face mask Intubation Type: IV induction Placement Confirmation: positive ETCO2

## 2015-07-15 NOTE — Transfer of Care (Signed)
Immediate Anesthesia Transfer of Care Note  Patient: Samantha Ayala  Procedure(s) Performed: Procedure(s): LEFT TOTAL KNEE ARTHROPLASTY (Left)  Patient Location: PACU  Anesthesia Type:MAC combined with regional for post-op pain  Level of Consciousness: awake, alert  and oriented  Airway & Oxygen Therapy: Patient Spontanous Breathing and Patient connected to face mask oxygen  Post-op Assessment: Report given to RN and Post -op Vital signs reviewed and stable  Post vital signs: Reviewed and stable  Last Vitals:  Filed Vitals:   07/15/15 0940 07/15/15 0945  BP: 130/52 124/53  Pulse: 66 76  Temp:    Resp: 15 15    Last Pain:  Filed Vitals:   07/15/15 0946  PainSc: 2          Complications: No apparent anesthesia complications

## 2015-07-15 NOTE — Brief Op Note (Signed)
07/15/2015  1:13 PM  PATIENT:  Samantha Ayala  80 y.o. female  PRE-OPERATIVE DIAGNOSIS:  left knee osteoarthritis, end stage  POST-OPERATIVE DIAGNOSIS:  left knee osteoarthritis, end stage  PROCEDURE:  Procedure(s): LEFT TOTAL KNEE ARTHROPLASTY (Left) DePuy Attune  SURGEON:  Surgeon(s) and Role:    * Netta Cedars, MD - Primary  PHYSICIAN ASSISTANT:   ASSISTANTS: Narda Amber PA-C  ANESTHESIA:   regional and spinal  EBL:  Total I/O In: 700 [I.V.:700] Out: 475 [Urine:425; Blood:50]  BLOOD ADMINISTERED:none  DRAINS: none   LOCAL MEDICATIONS USED:  NONE  SPECIMEN:  No Specimen  DISPOSITION OF SPECIMEN:  N/A  COUNTS:  YES  TOURNIQUET:   Total Tourniquet Time Documented: Thigh (Left) - 89 minutes Total: Thigh (Left) - 89 minutes   DICTATION: .Other Dictation: Dictation Number [id number]  PLAN OF CARE: Admit to inpatient   PATIENT DISPOSITION:  PACU - hemodynamically stable.   Delay start of Pharmacological VTE agent (>24hrs) due to surgical blood loss or risk of bleeding: no

## 2015-07-15 NOTE — Anesthesia Preprocedure Evaluation (Addendum)
Anesthesia Evaluation  Patient identified by MRN, date of birth, ID band Patient awake    Reviewed: Allergy & Precautions, NPO status , Patient's Chart, lab work & pertinent test results  Airway Mallampati: II  TM Distance: >3 FB Neck ROM: Full    Dental  (+) Dental Advisory Given, Edentulous Upper, Edentulous Lower   Pulmonary neg pulmonary ROS,    Pulmonary exam normal breath sounds clear to auscultation       Cardiovascular negative cardio ROS Normal cardiovascular exam Rhythm:Regular Rate:Normal     Neuro/Psych negative neurological ROS  negative psych ROS   GI/Hepatic Neg liver ROS, hiatal hernia, PUD, GERD  Medicated,  Endo/Other  negative endocrine ROS  Renal/GU negative Renal ROS     Musculoskeletal  (+) Arthritis , Osteoarthritis,    Abdominal   Peds  Hematology negative hematology ROS (+)   Anesthesia Other Findings Day of surgery medications reviewed with the patient.  Reproductive/Obstetrics                            Anesthesia Physical Anesthesia Plan  ASA: II  Anesthesia Plan: Spinal and MAC   Post-op Pain Management:    Induction: Intravenous  Airway Management Planned:   Additional Equipment:   Intra-op Plan:   Post-operative Plan:   Informed Consent: I have reviewed the patients History and Physical, chart, labs and discussed the procedure including the risks, benefits and alternatives for the proposed anesthesia with the patient or authorized representative who has indicated his/her understanding and acceptance.   Dental advisory given  Plan Discussed with: CRNA  Anesthesia Plan Comments: (Discussed risks and benefits of and differences between spinal and general. Discussed risks of spinal including headache, backache, failure, bleeding, infection, and nerve damage. Patient consents to spinal. Questions answered. Coagulation studies and platelet count  acceptable.)        Anesthesia Quick Evaluation

## 2015-07-16 LAB — CBC
HCT: 37.5 % (ref 36.0–46.0)
Hemoglobin: 11.9 g/dL — ABNORMAL LOW (ref 12.0–15.0)
MCH: 27.7 pg (ref 26.0–34.0)
MCHC: 31.7 g/dL (ref 30.0–36.0)
MCV: 87.2 fL (ref 78.0–100.0)
PLATELETS: 179 10*3/uL (ref 150–400)
RBC: 4.3 MIL/uL (ref 3.87–5.11)
RDW: 14 % (ref 11.5–15.5)
WBC: 7.9 10*3/uL (ref 4.0–10.5)

## 2015-07-16 LAB — BASIC METABOLIC PANEL
ANION GAP: 9 (ref 5–15)
BUN: 6 mg/dL (ref 6–20)
CALCIUM: 8.7 mg/dL — AB (ref 8.9–10.3)
CO2: 29 mmol/L (ref 22–32)
CREATININE: 0.76 mg/dL (ref 0.44–1.00)
Chloride: 101 mmol/L (ref 101–111)
GFR calc Af Amer: >60 mL/min (ref >60)
Glucose, Bld: 118 mg/dL — ABNORMAL HIGH (ref 65–99)
Potassium: 3.2 mmol/L — ABNORMAL LOW (ref 3.5–5.1)
Sodium: 139 mmol/L (ref 135–145)

## 2015-07-16 LAB — PROTIME-INR
INR: 1.26 (ref 0.00–1.49)
Prothrombin Time: 15.9 seconds — ABNORMAL HIGH (ref 11.6–15.2)

## 2015-07-16 MED ORDER — WARFARIN SODIUM 5 MG PO TABS
5.0000 mg | ORAL_TABLET | Freq: Once | ORAL | Status: AC
  Administered 2015-07-16: 5 mg via ORAL
  Filled 2015-07-16: qty 1

## 2015-07-16 NOTE — Op Note (Signed)
Samantha, Ayala               ACCOUNT NO.:  [id number]  MEDICAL RECORD NO.:  [id number]  LOCATION:                               FACILITY:  [facility]  PHYSICIAN:  Doran Heater. Veverly Fells, M.D. DATE OF BIRTH:  [birth year]  DATE OF PROCEDURE:  07/15/2015 DATE OF DISCHARGE:                              OPERATIVE REPORT   PREOPERATIVE DIAGNOSIS:  Left knee end-stage arthritis.  POSTOPERATIVE DIAGNOSES:  Left knee end-stage arthritis.  PROCEDURE PERFORMED:  Left total knee replacement using the DePuy Attune prosthesis.  ATTENDING SURGEON:  Doran Heater. Veverly Fells, M.D.  ASSISTANT:  Judith Part. Chabon, PA-C.  ANESTHESIA:  spinal anesthesia plus regional block was used.  ESTIMATED BLOOD LOSS:  Minimal.  FLUID REPLACED:  1500 mL crystalloid.  COUNTS:  Instrument counts were correct.  COMPLICATIONS:  There were no complications.  ANTIBIOTICS:  Perioperative antibiotics were given.  TOURNIQUET TIME:  89 minutes at 350 mmHg.  INDICATIONS:  The patient is a 80 year old female with worsening left knee pain secondary to end-stage arthritis.  The patient has bone-on- bone changes on x-ray and has had debilitating pain despite conservative management including injections, modification, activities, and pain medications.  The patient presents now for total knee arthroplasty to restore function and eliminate pain.  Informed consent obtained.  DESCRIPTION OF PROCEDURE:  After an adequate level of anesthesia was achieved, the patient was positioned supine on the operating table. Left leg correctly identified, nonsterile tourniquet placed in the proximal thigh.  Left leg was sterilely prepped and draped in usual manner.  Time-out was called.  We elevated the leg and exsanguinated using an Esmarch bandage, and then elevated the tourniquet to 350 mmHg. We placed the knee in flexion.  A longitudinal midline incision created with a 10 blade scalpel.  Dissection down through subcutaneous tissues. A medial  parapatellar arthrotomy created with a fresh #10 blade scalpel. Lateral patellofemoral ligaments everted, patella everted, lateral patellofemoral ligaments divided and the patella was everted.  Distal femur entered using a step-cut drill.  Distal femoral resection performed with an intramedullary guide.  9 mm of bone resected set on 3 degrees left.  We then went ahead and cut our tibia.  We removed ACL, PCL, and meniscal tissue, subluxed the tibia forward.  I did a 90 degree perpendicular cut on the tibia using the external alignment jig with 3- degree posterior slope.  We were pleased that resection just resecting 2 mm off the affected medial side.  We then went and checked our gap, which was at least 6 mm.  We then went ahead and completed our femoral preparation with the modular with the 4-in-1 block cutting anterior, posterior, and chamfer cuts, and this was for the size 6 DePuy Attune prosthesis.  We then went ahead and did our modular drill and keel punch for the tibial component.  We impacted that tibial component in place. We then inserted the 6 trial poly, and had a nice fit to it.  With full extension and nice flexion stability, we then resurfaced the patella going from the 25-mm thickness down to approximately 16 mm thickness. We drilled lugs for a 35 patella and inserted the 35 patellar button in  place, and ranged the knee with a nice tracking with no-touch technique. We irrigated thoroughly, and then removed all trial components, pulse irrigated the knee, dried the knee.  We did drill a hard spot on the medial tibial plateau, and then went ahead and cemented the components into place with DePuy high viscosity cement, tibia first, femur, and then patella.  We placed the knee in extension with a size 6 poly and allowed the cement to harden.  We removed the excess cement with a 0.25- inch curved osteotome.  We then went ahead and checked our gaps, and we felt like we had the  right poly with a size 6 or 6 mm poly for this size 6 femur 6 tibia.  We removed the trial poly, inserted the real poly, and had reduced the knee with a nice little medial snap, and then we were able to range it, full extension, flexion nicely with patellar tracking perfect.  We irrigated thoroughly and then closed the knee.  We also did the IV TXA that was placed in the patient's circulation prior to dropping the tourniquet.  We then closed the parapatellar arthrotomy with #1 Vicryl suture interrupted followed by 0 and 2 layered subcutaneous closure and 4-0 Monocryl for skin.  Steri-Strips applied followed by sterile dressing.  The patient tolerated the surgery well.     Doran Heater. Veverly Fells, M.D.   ______________________________ Doran Heater. Veverly Fells, M.D.    SRN/MEDQ  D:  07/15/2015  T:  07/16/2015  Job:  [id number]

## 2015-07-16 NOTE — Clinical Social Work Note (Signed)
Clinical Social Work Assessment  Patient Details  Name: Samantha Ayala MRN: [id number] Date of Birth: 05/15/1935  Date of referral:  07/16/15               Reason for consult:  Facility Placement, Discharge Planning                Permission sought to share information with:  Case Manager, Customer service manager, Family Supports Permission granted to share information::  Yes, Verbal Permission Granted  Name::      Facilities manager )  Agency::   (SNF)  Relationship::   (Dtr)  Contact Information:   [phone])  Housing/Transportation Living arrangements for the past 2 months:  [place] of Information:  Patient Patient Interpreter Needed:  None Criminal Activity/Legal Involvement Pertinent to Current Situation/Hospitalization:  No - Comment as needed Significant Relationships:  Adult Children Lives with:  Self Do you feel safe going back to the place where you live?  No Need for family participation in patient care:  Yes (Comment)  Care giving concerns: Pt scheduled for surgery for knee replacement. PT recommending SNF placement.    Social Worker assessment / plan: CSW met with pt and dtr in reference to SNF placement. Pt requested to be sent to [facility]. CSW introduced role and SNF process. Pt reported that she wants to be sent to [facility] because it is closer to her house. Pt provided with SNF list to choose second SNF in case her fist choice is not available. Pt hopeful about good recovery outcome with rehab.   No further concerns reported at this time. CSW will continue to follow pt and pt's family for continued support and to facilitate pt's discharge needs once medically stable for discharge.   Employment status:  Retired Nurse, adult PT Recommendations:  [place] / Referral to community resources:  [place]  Patient/Family's Response to care: Pt  sitting in recliner next to her dtr. Pt very pleasant Pt a/o x4. Pt agreeable to SNF placement and prefers [facility]. Pt and dtr hopeful for good recovery outcome. Pt dtr supportive and involved with care. Pt appreciated CSW intervention.   Patient/Family's Understanding of and Emotional Response to Diagnosis, Current Treatment, and Prognosis: Pt and dtr knowledgeable of medical and surgical interventions and understanding of PT rec.   Emotional Assessment Appearance:  Appears stated age, Well-Groomed Attitude/Demeanor/Rapport:   (Pleasant ) Affect (typically observed):  Accepting, Hopeful, Pleasant Orientation:  Oriented to Self, Oriented to Place, Oriented to  Time, Oriented to Situation Alcohol / Substance use:  Never Used Psych involvement (Current and /or in the community):  No (Comment)  Discharge Needs  Concerns to be addressed:  No discharge needs identified Readmission within the last 30 days:  No Current discharge risk:  Dependent with Mobility Barriers to Discharge:  Continued Medical Work up  Gracy Bruins,  MSW, [employer], [place], LCSW 07/16/2015, 3:51 PM

## 2015-07-16 NOTE — Evaluation (Signed)
Physical Therapy Evaluation Patient Details Name: Samantha Ayala MRN: [id number] DOB: 04-20-1935 Today's Date: 07/16/2015   History of Present Illness  admitted for elective TKR left; PMH significant for OA, toe amputation  Clinical Impression  Pt is s/p TKA resulting in the deficits listed below (see PT Problem List).  Pt will benefit from skilled PT to increase their independence and safety with mobility to allow discharge to the venue listed below.      Follow Up Recommendations SNF    Equipment Recommendations  3in1 (PT)    Recommendations for Other Services       Precautions / Restrictions Precautions Precautions: Knee Required Braces or Orthoses: Knee Immobilizer - Left Knee Immobilizer - Left: Other (comment) Restrictions Weight Bearing Restrictions: Yes LLE Weight Bearing: Weight bearing as tolerated      Mobility  Bed Mobility Overal bed mobility: Needs Assistance Bed Mobility: Supine to Sit     Supine to sit: Mod assist     General bed mobility comments: assist to raise shoulders off bed; verbal cues for technique  Transfers Overall transfer level: Needs assistance Equipment used: Rolling walker (2 wheeled) Transfers: Sit to/from Stand Sit to Stand: Min assist         General transfer comment: assis to power up from bed/toilet  Ambulation/Gait Ambulation/Gait assistance: Min assist Ambulation Distance (Feet): 15 Feet Assistive device: Rolling walker (2 wheeled) Gait Pattern/deviations: Step-to pattern;Decreased step length - left;Decreased stance time - left;Decreased weight shift to left;Antalgic Gait velocity: decreased      Stairs            Wheelchair Mobility    Modified Rankin (Stroke Patients Only)       Balance Overall balance assessment: Needs assistance Sitting-balance support: No upper extremity supported Sitting balance-Leahy Scale: Good     Standing balance support: Bilateral upper extremity supported Standing  balance-Leahy Scale: Poor Standing balance comment: reliant on RW for support                             Pertinent Vitals/Pain Pain Assessment: 0-10 Pain Score: 10-Worst pain ever Pain Location: left knee during gait Pain Descriptors / Indicators: Aching;Sore Pain Intervention(s): Limited activity within patient's tolerance;Monitored during session;Premedicated before session;Relaxation    Home Living Family/patient expects to be discharged to:: Skilled nursing facility Living Arrangements: Alone Available Help at Discharge: Friend(s) Type of Home: Mobile home Home Access: Stairs to enter Entrance Stairs-Rails: Psychiatric nurse of Steps: 4 Home Layout: One level Home Equipment: Walker - 4 wheels      Prior Function Level of Independence: Independent               Hand Dominance        Extremity/Trunk Assessment   Upper Extremity Assessment: Overall WFL for tasks assessed           Lower Extremity Assessment: LLE deficits/detail   LLE Deficits / Details: active ankle pump, quad set, limited ROM due to surgery     Communication   Communication: No difficulties  Cognition Arousal/Alertness: Awake/alert Behavior During Therapy: WFL for tasks assessed/performed Overall Cognitive Status: Within Functional Limits for tasks assessed                      General Comments      Exercises Total Joint Exercises Ankle Circles/Pumps: AROM;Both;10 reps;Supine Quad Sets: AROM;Left;10 reps;Supine Short Arc Quad: AAROM;Left;10 reps;Supine Heel Slides: AAROM;Left;10 reps;Supine Hip ABduction/ADduction: AAROM;Left;10  reps;Supine Straight Leg Raises: AAROM;Left;10 reps;Supine      Assessment/Plan    PT Assessment Patient needs continued PT services  PT Diagnosis Difficulty walking;Acute pain   PT Problem List Decreased strength;Decreased range of motion;Decreased activity tolerance;Decreased balance;Decreased  mobility;Decreased knowledge of precautions;Decreased knowledge of use of DME;Pain  PT Treatment Interventions DME instruction;Gait training;Functional mobility training;Therapeutic activities;Therapeutic exercise;Patient/family education;Balance training   PT Goals (Current goals can be found in the Care Plan section) Acute Rehab PT Goals Patient Stated Goal: get stronger and get back home PT Goal Formulation: With patient/family Time For Goal Achievement: 07/23/15 Potential to Achieve Goals: Good    Frequency 7X/week   Barriers to discharge Decreased caregiver support      Co-evaluation               End of Session   Activity Tolerance: Patient tolerated treatment well;Patient limited by pain Patient left: in chair;with family/visitor present;with call bell/phone within reach           Time: 1020-1052 PT Time Calculation (min) (ACUTE ONLY): 32 min   Charges:   PT Evaluation $PT Eval Moderate Complexity: 1 Procedure PT Treatments $Gait Training: 8-22 mins   PT G Codes:        [name] 07/16/2015, 11:09 AM

## 2015-07-16 NOTE — Progress Notes (Signed)
ANTICOAGULATION CONSULT NOTE Pharmacy Consult for warfarin Indication: VTE prophylaxis  No Known Allergies  Patient Measurements: Height: 5\' 8"  (172.7 cm) Weight: 196 lb (88.905 kg) IBW/kg (Calculated) : 63.9  Vital Signs: Temp: 99.3 F (37.4 C) (05/20 0425) Temp Source: Oral (05/20 0425) BP: 102/78 mmHg (05/20 0425) Pulse Rate: 83 (05/20 0425)  Labs:  Recent Labs  07/15/15 1648 07/16/15 0604  HGB 11.9* 11.9*  HCT 37.9 37.5  PLT 186 179  LABPROT  --  15.9*  INR  --  1.26  CREATININE 0.69 0.76    Estimated Creatinine Clearance: 67.6 mL/min (by C-G formula based on Cr of 0.76).   Medical History: Past Medical History  Diagnosis Date  . Stricture and stenosis of esophagus   . Unspecified hemorrhoids without mention of complication   . Angiodysplasia of intestine with hemorrhage   . Esophageal reflux   . Hiatal hernia   . Barrett esophagus   . Gastric polyp 06/20/2006    HYPERPLASTIC POLYP  . Personal history of colonic polyps   . Arthritis   . Osteoporosis   . GERD (gastroesophageal reflux disease)   . 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 alendronate (FOSAMAX) 70 MG tablet Take 70 mg by mouth every 7 (seven) days. Take with a full glass of water on an empty stomach.    Past Week at Unknown time  . calcium-vitamin D (OSCAL WITH D) 500-200 MG-UNIT per tablet Take 1 tablet by mouth daily.     Past Week at Unknown time  . furosemide (LASIX) 40 MG tablet Take 40 mg by mouth daily.     07/14/2015 at Unknown time  . pantoprazole (PROTONIX) 40 MG tablet Take 1 tablet (40 mg total) by mouth daily. 30 tablet 11 07/14/2015 at Unknown time  . PRESCRIPTION MEDICATION Take 1 tablet by mouth at bedtime. B12 med. Pt states it is 20 mg   Past Month at Unknown time  . traMADol (ULTRAM) 50 MG tablet Take 50 mg by mouth every 6 (six) hours as needed for moderate pain.    Past Week at Unknown time    Assessment: 80 y/o female to begin warfarin for VTE prophylaxis s/p left TKA. Baseline INR not available. H/H 13.5/41.6, platelets 202 on 5/8. Also to begin enoxaparin 30 mg SQ q12h tomorrow morning until INR >= 1.8.  INR this am is 1.26 after starting coumadin on 5/19. CBC stable with no sxs of bleeding or other issues reported.  Goal of Therapy:  INR 2-3 Monitor platelets by anticoagulation protocol: Yes   Plan:  1. Repeat Warfarin 5 mg PO x 1 tonight 2. INR daily 3. F/U d/c enoxaparin INR >= 1.8  Vincenza Hews, PharmD, BCPS 07/16/2015, 11:31 AM Pager: [phone]

## 2015-07-16 NOTE — Op Note (Deleted)
Samantha Ayala, Samantha Ayala               ACCOUNT NO.:  [id number]  MEDICAL RECORD NO.:  [id number]  LOCATION:                               FACILITY:  [facility]  PHYSICIAN:  Doran Heater. Veverly Fells, M.D. DATE OF BIRTH:  [birth year]  DATE OF PROCEDURE:  07/15/2015 DATE OF DISCHARGE:                              OPERATIVE REPORT   PREOPERATIVE DIAGNOSIS:  Left knee end-stage arthritis.  POSTOPERATIVE DIAGNOSES:  Left knee end-stage arthritis.  PROCEDURE PERFORMED:  Left total knee replacement using the DePuy Attune prosthesis.  ATTENDING SURGEON:  Doran Heater. Veverly Fells, M.D.  ASSISTANT:  Judith Part. Chabon, PA-C.  ANESTHESIA:  spinal anesthesia plus regional block was used.  ESTIMATED BLOOD LOSS:  Minimal.  FLUID REPLACED:  1500 mL crystalloid.  COUNTS:  Instrument counts were correct.  COMPLICATIONS:  There were no complications.  ANTIBIOTICS:  Perioperative antibiotics were given.  TOURNIQUET TIME:  89 minutes at 350 mmHg.  INDICATIONS:  The patient is a 80 year old female with worsening left knee pain secondary to end-stage arthritis.  The patient has bone-on- bone changes on x-ray and has had debilitating pain despite conservative management including injections, modification, activities, and pain medications.  The patient presents now for total knee arthroplasty to restore function and eliminate pain.  Informed consent obtained.  DESCRIPTION OF PROCEDURE:  After an adequate level of anesthesia was achieved, the patient was positioned supine on the operating table. Left leg correctly identified, nonsterile tourniquet placed in the proximal thigh.  Left leg was sterilely prepped and draped in usual manner.  Time-out was called.  We elevated the leg and exsanguinated using an Esmarch bandage, and then elevated the tourniquet to 350 mmHg. We placed the knee in flexion.  A longitudinal midline incision created with a 10 blade scalpel.  Dissection down through subcutaneous tissues. A medial  parapatellar arthrotomy created with a fresh #10 blade scalpel. Lateral patellofemoral ligaments everted, patella everted, lateral patellofemoral ligaments divided and the patella was everted.  Distal femur entered using a step-cut drill.  Distal femoral resection performed with an intramedullary guide.  9 mm of bone resected set on 3 degrees left.  We then went ahead and cut our tibia.  We removed ACL, PCL, and meniscal tissue, subluxed the tibia forward.  I did a 90 degree perpendicular cut on the tibia using the external alignment jig with 3- degree posterior slope.  We were pleased that resection just resecting 2 mm off the affected medial side.  We then went and checked our gap, which was at least 6 mm.  We then went ahead and completed our femoral preparation with the modular with the 4-in-1 block cutting anterior, posterior, and chamfer cuts, and this was for the size 6 DePuy Attune prosthesis.  We then went ahead and did our modular drill and keel punch for the tibial component.  We impacted that tibial component in place. We then inserted the 6 trial poly, and had a nice fit to it.  With full extension and nice flexion stability, we then resurfaced the patella going from the 25-mm thickness down to approximately 16 mm thickness. We drilled lugs for a 35 patella and inserted the 35 patellar button in  place, and ranged the knee with a nice tracking with no-touch technique. We irrigated thoroughly, and then removed all trial components, pulse irrigated the knee, dried the knee.  We did drill a hard spot on the medial tibial plateau, and then went ahead and cemented the components into place with DePuy high viscosity cement, tibia first, femur, and then patella.  We placed the knee in extension with a size 6 poly and allowed the cement to harden.  We removed the excess cement with a 0.25- inch curved osteotome.  We then went ahead and checked our gaps, and we felt like we had the  right poly with a size 6 or 6 mm poly for this size 6 femur 6 tibia.  We removed the trial poly, inserted the real poly, and had reduced the knee with a nice little medial snap, and then we were able to range it, full extension, flexion nicely with patellar tracking perfect.  We irrigated thoroughly and then closed the knee.  We also did the IV TXA that was placed in the patient's circulation prior to dropping the tourniquet.  We then closed the parapatellar arthrotomy with #1 Vicryl suture interrupted followed by 0 and 2 layered subcutaneous closure and 4-0 Monocryl for skin.  Steri-Strips applied followed by sterile dressing.  The patient tolerated the surgery well.     Doran Heater. Veverly Fells, M.D.   ______________________________ Doran Heater. Veverly Fells, M.D.    SRN/MEDQ  D:  07/15/2015  T:  07/16/2015  Job:  [id number]

## 2015-07-16 NOTE — Progress Notes (Signed)
Orthopedic Tech Progress Note Patient Details:  Samantha Ayala 08/08/1935 [id number]  CPM Left Knee CPM Left Knee: On Left Knee Flexion (Degrees): 60 Left Knee Extension (Degrees): 0   [name] 07/16/2015, 12:34 PM

## 2015-07-16 NOTE — Clinical Social Work Placement (Signed)
   CLINICAL SOCIAL WORK PLACEMENT  NOTE  Date:  07/16/2015  Patient Details  Name: LUCENDA TRICKEL MRN: [id number] Date of Birth: 1935/04/23  Clinical Social Work is seeking post-discharge placement for this patient at the [place] level of care (*CSW will initial, date and re-position this form in  chart as items are completed):  Yes   Patient/family provided with [facility] Work Department's list of facilities offering this level of care within the geographic area requested by the patient (or if unable, by the patient's family).  Yes   Patient/family informed of their freedom to choose among providers that offer the needed level of care, that participate in Medicare, Medicaid or managed care program needed by the patient, have an available bed and are willing to accept the patient.  Yes   Patient/family informed of [facility]'s ownership interest in [facility] and [facility], as well as of the fact that they are under no obligation to receive care at these facilities.  PASRR submitted to EDS on 07/16/15     PASRR number received on       Existing PASRR number confirmed on       FL2 transmitted to all facilities in geographic area requested by pt/family on       FL2 transmitted to all facilities within larger geographic area on       Patient informed that his/her managed care company has contracts with or will negotiate with certain facilities, including the following:            Patient/family informed of bed offers received.  Patient chooses bed at       Physician recommends and patient chooses bed at      Patient to be transferred to   on  .  Patient to be transferred to facility by       Patient family notified on   of transfer.  Name of family member notified:        PHYSICIAN Please sign FL2     Additional Comment:    _______________________________________________ Rozell Searing, LCSW 07/16/2015, 4:21 PM

## 2015-07-16 NOTE — Progress Notes (Signed)
Orthopedics Progress Note  Subjective: Patient reporting some pain this morning but otherwise doing well  Objective:  Filed Vitals:   07/16/15 0012 07/16/15 0425  BP: 117/52 102/78  Pulse: 83 83  Temp: 100.1 F (37.8 C) 99.3 F (37.4 C)  Resp: 18 19    General: Awake and alert  Musculoskeletal: good quad set, ankle pumps without pain, no cords Neurovascularly intact  Lab Results  Component Value Date   WBC 7.9 07/16/2015   HGB 11.9* 07/16/2015   HCT 37.5 07/16/2015   MCV 87.2 07/16/2015   PLT 179 07/16/2015       Component Value Date/Time   NA 139 07/16/2015 0604   K 3.2* 07/16/2015 0604   CL 101 07/16/2015 0604   CO2 29 07/16/2015 0604   GLUCOSE 118* 07/16/2015 0604   BUN 6 07/16/2015 0604   CREATININE 0.76 07/16/2015 0604   CALCIUM 8.7* 07/16/2015 0604   GFRNONAA >60 07/16/2015 0604   GFRAA >60 07/16/2015 0604    Lab Results  Component Value Date   INR 1.26 07/16/2015    Assessment/Plan: POD 1 s/p Procedure(s): LEFT TOTAL KNEE ARTHROPLASTY Patient doing very well. Will need short term SNF rehab, patient requesting [facility] Will need FL-2 PT, OT, mobilization Pulmonary toilet and DVT prophylaxis, coumadin with Lovenox bridge  Doran Heater. Veverly Fells, MD 07/16/2015 6:42 AM

## 2015-07-16 NOTE — Progress Notes (Signed)
Occupational Therapy Evaluation Patient Details Name: Samantha Ayala MRN: [id number] DOB: 08-30-1935 Today's Date: 07/16/2015    History of Present Illness 80 y.o. female admitted for elective TKR left; PMH significant for OA, toe amputation   Clinical Impression   PTA, pt was independent with ADLs and mobility. Pt currently requires mod assist for LB ADLs and min assist for functional transfers and ambulation. Began education on knee precautions, compensatory strategies for ADLs, and proper use of DME. Pt plans to d/c to SNF for post-acute rehab - agree this is an appropriate venue for pt discharge. Pt will benefit from continued acute OT to increase independence and safety with ADLs and mobility to allow for safe discharge to the venue listed below. Will continue to follow acutely.    Follow Up Recommendations  SNF;Supervision/Assistance - 24 hour    Equipment Recommendations  Other (comment) (TBD in next venue)    Recommendations for Other Services       Precautions / Restrictions Precautions Precautions: Knee Precaution Booklet Issued: No Precaution Comments: Reviewed not placing pillow, ice pack or other object under L knee Required Braces or Orthoses: Knee Immobilizer - Left Knee Immobilizer - Left: On except when in CPM Restrictions Weight Bearing Restrictions: Yes LLE Weight Bearing: Weight bearing as tolerated      Mobility Bed Mobility Overal bed mobility: Needs Assistance Bed Mobility: Supine to Sit     Supine to sit: Min assist     General bed mobility comments: HOB flat, use of bedrails. Assist to move LLE off bed. Pt's daughter reports that pt bed is high and may need to practice this beofre returning home  Transfers Overall transfer level: Needs assistance Equipment used: Rolling walker (2 wheeled) Transfers: Sit to/from Stand Sit to Stand: Min assist         General transfer comment: Min assist for initial sit-stand transfer, min guard assist for  subsequent transfers. Verbal cues throughout for safe hand placement on seated surfaces.    Balance Overall balance assessment: Needs assistance Sitting-balance support: No upper extremity supported;Feet supported Sitting balance-Leahy Scale: Good     Standing balance support: Bilateral upper extremity supported;During functional activity Standing balance-Leahy Scale: Poor                              ADL Overall ADL's : Needs assistance/impaired     Grooming: Wash/dry hands;Min guard;Standing   Upper Body Bathing: Set up;Sitting   Lower Body Bathing: Minimal assistance;Sit to/from stand   Upper Body Dressing : Set up;Sitting   Lower Body Dressing: Minimal assistance;Sit to/from stand   Toilet Transfer: Minimal assistance;Cueing for safety;Ambulation;BSC;RW Toilet Transfer Details (indicate cue type and reason): BSC over toilet, cues to feel BSC on back of legs before reaching back to sit Toileting- Clothing Manipulation and Hygiene: Min guard;Sit to/from stand       Functional mobility during ADLs: Minimal assistance;Rolling walker General ADL Comments: Began education on knee precautions, compensatory strategies and proper use of DME. Pt's daughter present for OT eval.     Vision Vision Assessment?: No apparent visual deficits   Perception     Praxis      Pertinent Vitals/Pain Pain Assessment: 0-10 Pain Score: 7  Pain Location: L knee Pain Descriptors / Indicators: Aching;Sore Pain Intervention(s): Limited activity within patient's tolerance;Premedicated before session;Monitored during session;Repositioned;Ice applied     Hand Dominance Right   Extremity/Trunk Assessment Upper Extremity Assessment Upper Extremity Assessment: Overall [facility]  for tasks assessed   Lower Extremity Assessment Lower Extremity Assessment: LLE deficits/detail LLE Deficits / Details: decreased ROM and strength as expected post op LLE: Unable to fully assess due to pain    Cervical / Trunk Assessment Cervical / Trunk Assessment: Kyphotic   Communication Communication Communication: No difficulties   Cognition Arousal/Alertness: Awake/alert Behavior During Therapy: WFL for tasks assessed/performed Overall Cognitive Status: Within Functional Limits for tasks assessed                     General Comments       Exercises       Shoulder Instructions      Home Living Family/patient expects to be discharged to:: Private residence Living Arrangements: Alone Available Help at Discharge: Friend(s);Available 24 hours/day Type of Home: Mobile home Home Access: Stairs to enter Entrance Stairs-Number of Steps: 4 Entrance Stairs-Rails: Right;Left Home Layout: One level     Bathroom Shower/Tub: Walk-in Hydrologist: Standard     Home Equipment: Environmental consultant - 4 wheels;Cane - single point          Prior Functioning/Environment Level of Independence: Independent        Comments: Drives, retired    OT Diagnosis: Acute pain   OT Problem List: Decreased strength;Decreased range of motion;Decreased activity tolerance;Impaired balance (sitting and/or standing);Decreased safety awareness;Decreased knowledge of use of DME or AE;Decreased knowledge of precautions;Pain;Obesity   OT Treatment/Interventions: Self-care/ADL training;Therapeutic exercise;Energy conservation;DME and/or AE instruction;Therapeutic activities;Patient/family education;Balance training    OT Goals(Current goals can be found in the care plan section) Acute Rehab OT Goals Patient Stated Goal: get stronger and get back home OT Goal Formulation: With patient Time For Goal Achievement: 07/30/15 Potential to Achieve Goals: Good ADL Goals Pt Will Perform Grooming: standing;with modified independence Pt Will Perform Lower Body Bathing: with modified independence;sit to/from stand Pt Will Perform Lower Body Dressing: with modified independence;sit to/from  stand Pt Will Transfer to Toilet: with modified independence;ambulating;bedside commode (over toilet) Pt Will Perform Toileting - Clothing Manipulation and hygiene: with modified independence;sitting/lateral leans;sit to/from stand  OT Frequency: Min 2X/week   Barriers to D/C: Decreased caregiver support  Lives alone, daughter lives out of town       Co-evaluation              End of Session Equipment Utilized During Treatment: Gait belt;Left knee immobilizer;Rolling walker CPM Left Knee CPM Left Knee: Off Left Knee Flexion (Degrees): 60 Left Knee Extension (Degrees): 0 Nurse Communication: Mobility status  Activity Tolerance: Patient limited by pain Patient left: in chair;with call bell/phone within reach;with family/visitor present;Other (comment) (LLE elevated )   Time: 1425-1501 OT Time Calculation (min): 36 min Charges:  OT General Charges $OT Visit: 1 Procedure OT Evaluation $OT Eval Moderate Complexity: 1 Procedure OT Treatments $Self Care/Home Management : 8-22 mins G-Codes:    Redmond Baseman, OTR/L Pager: [phone] 07/16/2015, 3:13 PM

## 2015-07-17 LAB — CBC
HEMATOCRIT: 35 % — AB (ref 36.0–46.0)
Hemoglobin: 11.1 g/dL — ABNORMAL LOW (ref 12.0–15.0)
MCH: 27.3 pg (ref 26.0–34.0)
MCHC: 31.7 g/dL (ref 30.0–36.0)
MCV: 86.2 fL (ref 78.0–100.0)
Platelets: 173 10*3/uL (ref 150–400)
RBC: 4.06 MIL/uL (ref 3.87–5.11)
RDW: 13.8 % (ref 11.5–15.5)
WBC: 9.3 10*3/uL (ref 4.0–10.5)

## 2015-07-17 LAB — PROTIME-INR
INR: 1.44 (ref 0.00–1.49)
Prothrombin Time: 17.6 seconds — ABNORMAL HIGH (ref 11.6–15.2)

## 2015-07-17 MED ORDER — WARFARIN SODIUM 5 MG PO TABS
5.0000 mg | ORAL_TABLET | Freq: Once | ORAL | Status: AC
  Administered 2015-07-17: 5 mg via ORAL
  Filled 2015-07-17: qty 1

## 2015-07-17 NOTE — Progress Notes (Addendum)
Samantha Ayala  MRN: [id number] DOB/Age: 1935-07-30 80 y.o. Physician: Ander Slade, M.D. 2 Days Post-Op Procedure(s) (LRB): LEFT TOTAL KNEE ARTHROPLASTY (Left)  Subjective: Rested well last night, sitting comfortably in bedside chair this am, A and O, no c/o Vital Signs Temp:  [98.3 F (36.8 C)-99 F (37.2 C)] 99 F (37.2 C) (05/21 0615) Pulse Rate:  [87-95] 95 (05/21 0615) Resp:  [18] 18 (05/20 1553) BP: (126-135)/(49-70) 130/70 mmHg (05/21 0615) SpO2:  [93 %-99 %] 93 % (05/21 0615)  Lab Results  Recent Labs  07/16/15 0604 07/17/15 0405  WBC 7.9 9.3  HGB 11.9* 11.1*  HCT 37.5 35.0*  PLT 179 173   BMET  Recent Labs  07/15/15 1648 07/16/15 0604  NA  --  139  K  --  3.2*  CL  --  101  CO2  --  29  GLUCOSE  --  118*  BUN  --  6  CREATININE 0.69 0.76  CALCIUM  --  8.7*   INR  Date Value Ref Range Status  07/17/2015 1.44 0.00 - 1.49 Final     Exam  LLE with good ankle motion, minimal pain, ace wraps in place  Plan Continue post op TKA protocol, SNF likely in am  Samantha Ayala 07/17/2015, 8:52 AM    Contact # [id number] Dressings changed, SteriStrips intact, no drainage, mepilex applied

## 2015-07-17 NOTE — Progress Notes (Signed)
ANTICOAGULATION CONSULT NOTE Pharmacy Consult for warfarin Indication: VTE prophylaxis  No Known Allergies  Patient Measurements: Height: 5\' 8"  (172.7 cm) Weight: 196 lb (88.905 kg) IBW/kg (Calculated) : 63.9  Vital Signs: Temp: 99 F (37.2 C) (05/21 0615) Temp Source: Oral (05/21 0615) BP: 130/70 mmHg (05/21 0615) Pulse Rate: 95 (05/21 0615)  Labs:  Recent Labs  07/15/15 1648 07/16/15 0604 07/17/15 0405  HGB 11.9* 11.9* 11.1*  HCT 37.9 37.5 35.0*  PLT 186 179 173  LABPROT  --  15.9* 17.6*  INR  --  1.26 1.44  CREATININE 0.69 0.76  --     Estimated Creatinine Clearance: 67.6 mL/min (by C-G formula based on Cr of 0.76).   Medical History: Past Medical History  Diagnosis Date  . Stricture and stenosis of esophagus   . Unspecified hemorrhoids without mention of complication   . Angiodysplasia of intestine with hemorrhage   . Esophageal reflux   . Hiatal hernia   . Barrett esophagus   . Gastric polyp 06/20/2006    HYPERPLASTIC POLYP  . Personal history of colonic polyps   . Arthritis   . Osteoporosis   . GERD (gastroesophageal reflux disease)   . 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 alendronate (FOSAMAX) 70 MG tablet Take 70 mg by mouth every 7 (seven) days. Take with a full glass of water on an empty stomach.    Past Week at Unknown time  . calcium-vitamin D (OSCAL WITH D) 500-200 MG-UNIT per tablet Take 1 tablet by mouth daily.     Past Week at Unknown time  . furosemide (LASIX) 40 MG tablet Take 40 mg by mouth daily.     07/14/2015 at Unknown time  . pantoprazole (PROTONIX) 40 MG tablet Take 1 tablet (40 mg total) by mouth daily. 30 tablet 11 07/14/2015 at Unknown time  . PRESCRIPTION MEDICATION Take 1 tablet by mouth at bedtime. B12 med. Pt states it is 20 mg   Past Month at Unknown time  . traMADol (ULTRAM) 50 MG tablet Take 50 mg by mouth  every 6 (six) hours as needed for moderate pain.   Past Week at Unknown time    Assessment: 80 y/o female to begin warfarin for VTE prophylaxis s/p left TKA. Baseline INR not available. H/H 13.5/41.6, platelets 202 on 5/8. Also to begin enoxaparin 30 mg SQ q12h tomorrow morning until INR >= 1.8.  INR is 1.44 this morning after two doses of warfarin. CBC stable with no sxs of bleeding.   Goal of Therapy:  INR 2-3 Monitor platelets by anticoagulation protocol: Yes   Plan:  1. Repeat Warfarin 5 mg PO x 1 tonight 2. INR daily 3. F/U d/c enoxaparin INR >= 1.8  Vincenza Hews, PharmD, BCPS 07/17/2015, 10:53 AM Pager: [phone]

## 2015-07-17 NOTE — Progress Notes (Addendum)
Physical Therapy Treatment Patient Details Name: Samantha Ayala MRN: [id number] DOB: [birth year] Today's Date: 07/23/2015    History of Present Illness 80 y.o. female admitted for elective TKR left; PMH significant for OA, toe amputation    PT Comments    Patient streadily progressing with mobility and ROM.  Needs continued PT at SNF.  Follow Up Recommendations  SNF     Equipment Recommendations  3in1 (PT);Rolling walker with 5" wheels    Recommendations for Other Services       Precautions / Restrictions Precautions Precautions: Knee Required Braces or Orthoses: Knee Immobilizer - Left Knee Immobilizer - Left: On except when in CPM Restrictions LLE Weight Bearing: Weight bearing as tolerated    Mobility  Bed Mobility Overal bed mobility: Needs Assistance Bed Mobility: Supine to Sit     Supine to sit: Min assist     General bed mobility comments: assit for LLE  Transfers Overall transfer level: Needs assistance Equipment used: Rolling walker (2 wheeled) Transfers: Sit to/from Stand Sit to Stand: Supervision         General transfer comment: min guard for safety  Ambulation/Gait Ambulation/Gait assistance: Min guard Ambulation Distance (Feet): 10 Feet (x 2) Assistive device: Rolling walker (2 wheeled) Gait Pattern/deviations: Step-to pattern;Decreased step length - left;Decreased weight shift to left;Antalgic Gait velocity: decreased Gait velocity interpretation: Below normal speed for age/gender  Stairs            Wheelchair Mobility    Modified Rankin (Stroke Patients Only)       Balance           Standing balance support: Bilateral upper extremity supported Standing balance-Leahy Scale: Poor Standing balance comment: reliant on RW for support                    Cognition Arousal/Alertness: Awake/alert Behavior During Therapy: WFL for tasks assessed/performed Overall Cognitive Status: Within Functional Limits for tasks  assessed                      Exercises Total Joint Exercises  Heel Slides: AAROM;Left;10 reps;Supine Hip ABduction/ADduction: AAROM;Left;10 reps;Supine Straight Leg Raises: AAROM;Left;10 reps;Supine     General Comments        Pertinent Vitals/Pain Pain Score: 5  Pain Location: L knee Pain Descriptors / Indicators: Sore Pain Intervention(s): Limited activity within patient's tolerance;Monitored during session    Home Living                      Prior Function            PT Goals (current goals can now be found in the care plan section) Progress towards PT goals: Progressing toward goals    Frequency  7X/week    PT Plan Current plan remains appropriate    Co-evaluation             End of Session Equipment Utilized During Treatment: Gait belt Activity Tolerance: Patient tolerated treatment well;Patient limited by pain;Patient limited by fatigue Patient left: with call bell/phone within reach;in bed     Time: 1412-1430 PT Time Calculation (min) (ACUTE ONLY): 18 min  Charges:  $Gait Training: 8-22 mins $Therapeutic Exercise: 8-22 mins                    G Codes:      [name] 07-23-2015, 2:34 PM 07/23/15 [name], PT [phone]

## 2015-07-17 NOTE — Progress Notes (Signed)
Orthopedic Tech Progress Note Patient Details:  Samantha Ayala Aug 16, 1935 [id number]  Patient ID: Samantha Ayala, female   DOB: 04-Apr-1935, 80 y.o.   MRN: [id number] Applied cpm 0-60  Samantha Ayala 07/17/2015, 5:46 AM

## 2015-07-17 NOTE — Progress Notes (Signed)
Physical Therapy Treatment Patient Details Name: Samantha Ayala MRN: [id number] DOB: [birth year] Today's Date: 07/17/2015    History of Present Illness 80 y.o. female admitted for elective TKR left; PMH significant for OA, toe amputation    PT Comments    Patient slowly progressing with mobility, still requiring assistance and still fatiguing easily.  Agree that patient will benefit from short-term SNF prior to returning home.  Will continue to follow acutely.  Follow Up Recommendations  SNF     Equipment Recommendations  3in1 (PT);Rolling walker with 5" wheels    Recommendations for Other Services       Precautions / Restrictions Precautions Precautions: Knee Required Braces or Orthoses: Knee Immobilizer - Left Restrictions LLE Weight Bearing: Weight bearing as tolerated    Mobility  Bed Mobility               General bed mobility comments: in recliner upon arrival  Transfers Overall transfer level: Needs assistance Equipment used: Rolling walker (2 wheeled) Transfers: Sit to/from Stand Sit to Stand: Min guard         General transfer comment: min guard for safety  Ambulation/Gait Ambulation/Gait assistance: Min guard Ambulation Distance (Feet): 30 Feet Assistive device: Rolling walker (2 wheeled) Gait Pattern/deviations: Step-to pattern;Decreased step length - left;Decreased weight shift to left;Antalgic Gait velocity: decreased Gait velocity interpretation: Below normal speed for age/gender General Gait Details: patient with increased pain in LLE during gait.  I applied knee immobilizer and that seemed to decrease pain and increase stride.     Stairs            Wheelchair Mobility    Modified Rankin (Stroke Patients Only)       Balance           Standing balance support: Bilateral upper extremity supported Standing balance-Leahy Scale: Poor Standing balance comment: reliant on RW for support                    Cognition  Arousal/Alertness: Awake/alert Behavior During Therapy: WFL for tasks assessed/performed Overall Cognitive Status: Within Functional Limits for tasks assessed                      Exercises Total Joint Exercises Ankle Circles/Pumps: AROM;Both;10 reps;Seated Quad Sets: AROM;Left;10 reps;Seated Long Arc Quad: AAROM;Left;10 reps;Seated Knee Flexion: AAROM;Left;10 reps;Seated Goniometric ROM: flexion = 60    General Comments        Pertinent Vitals/Pain Pain Score: 5  Pain Location: L knee Pain Descriptors / Indicators: Sore Pain Intervention(s): Limited activity within patient's tolerance;Monitored during session;Premedicated before session    Home Living                      Prior Function            PT Goals (current goals can now be found in the care plan section) Progress towards PT goals: Progressing toward goals    Frequency  7X/week    PT Plan Current plan remains appropriate    Co-evaluation             End of Session Equipment Utilized During Treatment: Gait belt Activity Tolerance: Patient tolerated treatment well;Patient limited by pain;Patient limited by fatigue Patient left: in chair;with family/visitor present;with call bell/phone within reach     Time: 1115-1139 PT Time Calculation (min) (ACUTE ONLY): 24 min  Charges:  $Gait Training: 8-22 mins $Therapeutic Exercise: 8-22 mins  G Codes:      [name] 07-31-15, 2:10 PM  07-31-15 [name], [place]

## 2015-07-17 NOTE — NC FL2 (Signed)
[place] LEVEL OF CARE SCREENING TOOL     IDENTIFICATION  Patient Name: Samantha Ayala Birthdate: [birth year] Sex: female Admission Date (Current Location): 07/15/2015  [facility] and [place] Number:  Herbalist and Address:  [facility] [place], [place] [address] [place], [place] [address]      Provider Number: [id number]  Attending Physician Name and Address:  Netta Cedars, MD  Relative Name and Phone Number:       Current Level of Care: Hospital Recommended Level of Care: [place] Prior Approval Number:    Date Approved/Denied:   PASRR Number:   [id number]  Discharge Plan: SNF    Current Diagnoses: Patient Active Problem List   Diagnosis Date Noted  . S/P total knee replacement using cement 07/15/2015  . Barrett's esophageal ulceration 05/09/2011  . Special screening for malignant neoplasms, colon 05/09/2011  . AVM (arteriovenous malformation) of colon 05/09/2011  . Multiple gastric polyps 05/09/2011  . Status post laparoscopic fundoplication [id number]  . GERD (gastroesophageal reflux disease) 10/10/2010  . ESOPHAGEAL STRICTURE 01/12/2008  . BARRETTS ESOPHAGUS 01/12/2008  . GASTRIC POLYP 10/23/2004  . 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 HIATAL HERNIA 10/23/2004  . GASTROESOPHAGEAL REFLUX DISEASE, CHRONIC 10/12/2002  . HEMORRHOIDS 06/23/2001  . ANGIODYSPLASIA OF INTESTINE WITH HEMORRHAGE 06/23/2001    Orientation RESPIRATION BLADDER Height & Weight     Self, Time, Situation, Place  Normal Continent Weight: 196 lb (88.905 kg) Height:  5\' 8"  (172.7 cm)  BEHAVIORAL SYMPTOMS/MOOD NEUROLOGICAL BOWEL NUTRITION STATUS   (None )  (None) Continent Diet (Regular)  AMBULATORY STATUS COMMUNICATION OF NEEDS Skin   Limited Assist Verbally Surgical wounds                       Personal Care Assistance Level of Assistance  Bathing, Dressing, Feeding Bathing Assistance: Limited  assistance Feeding assistance: Independent Dressing Assistance: Limited assistance     Functional Limitations Info  Sight, Hearing, Speech Sight Info: Adequate Hearing Info: Adequate Speech Info: Adequate    SPECIAL CARE FACTORS FREQUENCY  PT (By licensed PT), OT (By licensed OT)     PT Frequency:  (7x) OT Frequency:  (2x)            Contractures      Additional Factors Info  Code Status, Allergies (Full )   Allergies Info:  (None )           Current Medications (07/17/2015):  This is the current hospital active medication list Current Facility-Administered Medications  Medication Dose Route Frequency Provider Last Rate Last Dose  . 0.9 %  sodium chloride infusion   Intravenous Continuous Netta Cedars, MD 50 mL/hr at 07/15/15 1746    . acetaminophen (TYLENOL) tablet 650 mg  650 mg Oral Q6H PRN Netta Cedars, MD       Or  . acetaminophen (TYLENOL) suppository 650 mg  650 mg Rectal Q6H PRN Netta Cedars, MD      . bisacodyl (DULCOLAX) suppository 10 mg  10 mg Rectal Daily PRN Netta Cedars, MD      . calcium-vitamin D (OSCAL WITH D) 500-200 MG-UNIT per tablet 1 tablet  1 tablet Oral Daily Netta Cedars, MD   1 tablet at 07/16/15 0941  . coumadin book   Does not apply Once [place], [facility]      . docusate sodium (COLACE) capsule 100 mg  100 mg Oral BID Netta Cedars, MD  100 mg at 07/16/15 2124  . enoxaparin (LOVENOX) injection 30 mg  30 mg Subcutaneous Q12H Netta Cedars, MD   30 mg at 07/17/15 [phone]  . fentaNYL (SUBLIMAZE) injection 50 mcg  50 mcg Intravenous Once Catalina Gravel, MD      . ferrous sulfate tablet 325 mg  325 mg Oral TID PC Netta Cedars, MD   325 mg at 07/17/15 [id number]  . furosemide (LASIX) tablet 40 mg  40 mg Oral Daily Netta Cedars, MD   40 mg at 07/16/15 0941  . HYDROcodone-acetaminophen (NORCO/VICODIN) 5-325 MG per tablet 1-2 tablet  1-2 tablet Oral Q4H PRN Netta Cedars, MD   2 tablet at 07/17/15 [phone]  . lactated ringers infusion   Intravenous  Continuous Catalina Gravel, MD 10 mL/hr at 07/15/15 [phone]    . menthol-cetylpyridinium (CEPACOL) lozenge 3 mg  1 lozenge Oral PRN Netta Cedars, MD       Or  . phenol [facility]) mouth spray 1 spray  1 spray Mouth/Throat PRN Netta Cedars, MD      . methocarbamol (ROBAXIN) tablet 500 mg  500 mg Oral Q6H PRN Netta Cedars, MD   500 mg at 07/16/15 2124   Or  . methocarbamol (ROBAXIN) 500 mg in dextrose 5 % 50 mL IVPB  500 mg Intravenous Q6H PRN Netta Cedars, MD      . metoCLOPramide (REGLAN) tablet 5-10 mg  5-10 mg Oral Q8H PRN Netta Cedars, MD       Or  . metoCLOPramide (REGLAN) injection 5-10 mg  5-10 mg Intravenous Q8H PRN Netta Cedars, MD      . morphine 2 MG/ML injection 2 mg  2 mg Intravenous Q2H PRN Netta Cedars, MD   2 mg at 07/16/15 2123  . ondansetron (ZOFRAN) tablet 4 mg  4 mg Oral Q6H PRN Netta Cedars, MD       Or  . ondansetron [facility]) injection 4 mg  4 mg Intravenous Q6H PRN Netta Cedars, MD      . pantoprazole (PROTONIX) EC tablet 40 mg  40 mg Oral Daily Netta Cedars, MD   40 mg at 07/16/15 0941  . polyethylene glycol (MIRALAX / GLYCOLAX) packet 17 g  17 g Oral Daily PRN Netta Cedars, MD      . traMADol Veatrice Bourbon) tablet 50 mg  50 mg Oral Q6H PRN Netta Cedars, MD      . Warfarin - Pharmacist Dosing Inpatient   Does not apply [place], [facility]         Discharge Medications: Please see discharge summary for a list of discharge medications.  Relevant Imaging Results:  Relevant Lab Results:   Additional Information SN: [id number]  Rozell Searing, LCSW

## 2015-07-18 LAB — CBC
HCT: 32.4 % — ABNORMAL LOW (ref 36.0–46.0)
Hemoglobin: 10.3 g/dL — ABNORMAL LOW (ref 12.0–15.0)
MCH: 27.4 pg (ref 26.0–34.0)
MCHC: 31.8 g/dL (ref 30.0–36.0)
MCV: 86.2 fL (ref 78.0–100.0)
PLATELETS: 175 10*3/uL (ref 150–400)
RBC: 3.76 MIL/uL — ABNORMAL LOW (ref 3.87–5.11)
RDW: 13.8 % (ref 11.5–15.5)
WBC: 7.3 10*3/uL (ref 4.0–10.5)

## 2015-07-18 LAB — PROTIME-INR
INR: 1.71 — AB (ref 0.00–1.49)
PROTHROMBIN TIME: 20.1 s — AB (ref 11.6–15.2)

## 2015-07-18 NOTE — Clinical Social Work Placement (Signed)
   CLINICAL SOCIAL WORK PLACEMENT  NOTE  Date:  07/18/2015  Patient Details  Name: Samantha Ayala MRN: [id number] Date of Birth: 09/23/1935  Clinical Social Work is seeking post-discharge placement for this patient at the [place] level of care (*CSW will initial, date and re-position this form in  chart as items are completed):  Yes   Patient/family provided with [facility] Work Department's list of facilities offering this level of care within the geographic area requested by the patient (or if unable, by the patient's family).  Yes   Patient/family informed of their freedom to choose among providers that offer the needed level of care, that participate in Medicare, Medicaid or managed care program needed by the patient, have an available bed and are willing to accept the patient.  Yes   Patient/family informed of [facility]'s ownership interest in [facility] and [facility], as well as of the fact that they are under no obligation to receive care at these facilities.  PASRR submitted to EDS on 07/16/15     PASRR number received on       Existing PASRR number confirmed on       FL2 transmitted to all facilities in geographic area requested by pt/family on       FL2 transmitted to all facilities within larger geographic area on       Patient informed that his/her managed care company has contracts with or will negotiate with certain facilities, including the following:        Yes   Patient/family informed of bed offers received.  Patient chooses bed at [place], [place]     Physician recommends and patient chooses bed at      Patient to be transferred to [facility] on 07/18/15.  Patient to be transferred to facility by [facility]     Patient family notified on 07/18/15 of transfer.  Name of family member notified:  Patient     PHYSICIAN Please sign FL2     Additional Comment:     _______________________________________________ Caroline Sauger, LCSW 07/18/2015, 12:56 PM

## 2015-07-18 NOTE — Progress Notes (Signed)
Orthopedics Progress Note  Subjective: I am doing well  Objective:  Filed Vitals:   07/17/15 1951 07/18/15 0438  BP: 149/59 148/62  Pulse: 90 87  Temp: 98.5 F (36.9 C) 99.2 F (37.3 C)  Resp: 17 16    General: Awake and alert  Musculoskeletal: left knee wound CDI, dressing intact, no cords and no pain with ankle pumps Neurovascularly intact  Lab Results  Component Value Date   WBC 7.3 07/18/2015   HGB 10.3* 07/18/2015   HCT 32.4* 07/18/2015   MCV 86.2 07/18/2015   PLT 175 07/18/2015       Component Value Date/Time   NA 139 07/16/2015 0604   K 3.2* 07/16/2015 0604   CL 101 07/16/2015 0604   CO2 29 07/16/2015 0604   GLUCOSE 118* 07/16/2015 0604   BUN 6 07/16/2015 0604   CREATININE 0.76 07/16/2015 0604   CALCIUM 8.7* 07/16/2015 0604   GFRNONAA >60 07/16/2015 0604   GFRAA >60 07/16/2015 0604    Lab Results  Component Value Date   INR 1.71* 07/18/2015   INR 1.44 07/17/2015   INR 1.26 07/16/2015    Assessment/Plan: POD #3 s/p Procedure(s): LEFT TOTAL KNEE ARTHROPLASTY D/C to SNF rehab  Remo Lipps R. Veverly Fells, MD 07/18/2015 10:24 AM

## 2015-07-18 NOTE — Progress Notes (Signed)
IV removed. Report called to [facility] in [place], given to RN.  Understanding verbalized. Transportation via ambulance. Patient aware of transfer and belongings are packed.

## 2015-07-18 NOTE — Anesthesia Postprocedure Evaluation (Signed)
Anesthesia Post Note  Patient: Samantha Ayala  Procedure(s) Performed: Procedure(s) (LRB): LEFT TOTAL KNEE ARTHROPLASTY (Left)  Patient location during evaluation: PACU Anesthesia Type: General and Regional Level of consciousness: awake and alert Pain management: pain level controlled Vital Signs Assessment: post-procedure vital signs reviewed and stable Respiratory status: spontaneous breathing, nonlabored ventilation, respiratory function stable and patient connected to nasal cannula oxygen Cardiovascular status: blood pressure returned to baseline and stable Postop Assessment: no signs of nausea or vomiting Anesthetic complications: no    Last Vitals:  Filed Vitals:   07/17/15 1951 07/18/15 0438  BP: 149/59 148/62  Pulse: 90 87  Temp: 36.9 C 37.3 C  Resp: 17 16    Last Pain:  Filed Vitals:   07/18/15 0559  PainSc: 7                  Catalina Gravel

## 2015-07-18 NOTE — Clinical Social Work Note (Signed)
Patient to be discharged to [facility]. Patient updated regarding discharge. Patient to be transported via EMS. RN report number: [place]: [phone] Surgical: [phone]

## 2015-07-18 NOTE — Discharge Summary (Signed)
Physician Discharge Summary   Patient ID: Samantha Ayala MRN: [id number] DOB/AGE: [birth year] 78 y.o.  Admit date: 07/15/2015 Discharge date: 07/18/2015  Admission Diagnoses:  Active Problems:   S/P total knee replacement using cement   Discharge Diagnoses:  Same   Surgeries: Procedure(s): LEFT TOTAL KNEE ARTHROPLASTY on 07/15/2015   Consultants: PT, OT, social work  Discharged Condition: Stable  Hospital Course: Samantha Ayala is an 80 y.o. female who was admitted 07/15/2015 with a chief complaint of left knee pain, and found to have a diagnosis of left knee primary OA, end stage.  They were brought to the operating room on 07/15/2015 and underwent the above named procedures.    The patient had an uncomplicated hospital course and was stable for discharge.  Recent vital signs:  Filed Vitals:   07/17/15 1951 07/18/15 0438  BP: 149/59 148/62  Pulse: 90 87  Temp: 98.5 F (36.9 C) 99.2 F (37.3 C)  Resp: 17 16    Recent laboratory studies:  Results for orders placed or performed during the hospital encounter of 07/15/15  CBC  Result Value Ref Range   WBC 8.6 4.0 - 10.5 K/uL   RBC 4.36 3.87 - 5.11 MIL/uL   Hemoglobin 11.9 (L) 12.0 - 15.0 g/dL   HCT 37.9 36.0 - 46.0 %   MCV 86.9 78.0 - 100.0 fL   MCH 27.3 26.0 - 34.0 pg   MCHC 31.4 30.0 - 36.0 g/dL   RDW 13.8 11.5 - 15.5 %   Platelets 186 150 - 400 K/uL  Creatinine, serum  Result Value Ref Range   Creatinine, Ser 0.69 0.44 - 1.00 mg/dL   GFR calc non Af Amer >60 >60 mL/min   GFR calc Af Amer >60 >60 mL/min  Protime-INR  Result Value Ref Range   Prothrombin Time 15.9 (H) 11.6 - 15.2 seconds   INR 1.26 0.00 - 1.49  CBC  Result Value Ref Range   WBC 7.9 4.0 - 10.5 K/uL   RBC 4.30 3.87 - 5.11 MIL/uL   Hemoglobin 11.9 (L) 12.0 - 15.0 g/dL   HCT 37.5 36.0 - 46.0 %   MCV 87.2 78.0 - 100.0 fL   MCH 27.7 26.0 - 34.0 pg   MCHC 31.7 30.0 - 36.0 g/dL   RDW 14.0 11.5 - 15.5 %   Platelets 179 150 - 400 K/uL  Basic  metabolic panel  Result Value Ref Range   Sodium 139 135 - 145 mmol/L   Potassium 3.2 (L) 3.5 - 5.1 mmol/L   Chloride 101 101 - 111 mmol/L   CO2 29 22 - 32 mmol/L   Glucose, Bld 118 (H) 65 - 99 mg/dL   BUN 6 6 - 20 mg/dL   Creatinine, Ser 0.76 0.44 - 1.00 mg/dL   Calcium 8.7 (L) 8.9 - 10.3 mg/dL   GFR calc non Af Amer >60 >60 mL/min   GFR calc Af Amer >60 >60 mL/min   Anion gap 9 5 - 15  Protime-INR  Result Value Ref Range   Prothrombin Time 17.6 (H) 11.6 - 15.2 seconds   INR 1.44 0.00 - 1.49  CBC  Result Value Ref Range   WBC 9.3 4.0 - 10.5 K/uL   RBC 4.06 3.87 - 5.11 MIL/uL   Hemoglobin 11.1 (L) 12.0 - 15.0 g/dL   HCT 35.0 (L) 36.0 - 46.0 %   MCV 86.2 78.0 - 100.0 fL   MCH 27.3 26.0 - 34.0 pg   MCHC 31.7 30.0 - 36.0 g/dL  RDW 13.8 11.5 - 15.5 %   Platelets 173 150 - 400 K/uL  Protime-INR  Result Value Ref Range   Prothrombin Time 20.1 (H) 11.6 - 15.2 seconds   INR 1.71 (H) 0.00 - 1.49  CBC  Result Value Ref Range   WBC 7.3 4.0 - 10.5 K/uL   RBC 3.76 (L) 3.87 - 5.11 MIL/uL   Hemoglobin 10.3 (L) 12.0 - 15.0 g/dL   HCT 32.4 (L) 36.0 - 46.0 %   MCV 86.2 78.0 - 100.0 fL   MCH 27.4 26.0 - 34.0 pg   MCHC 31.8 30.0 - 36.0 g/dL   RDW 13.8 11.5 - 15.5 %   Platelets 175 150 - 400 K/uL    Discharge Medications:     Medication List    TAKE these medications        alendronate 70 MG tablet  Commonly known as:  FOSAMAX  Take 70 mg by mouth every 7 (seven) days. Take with a full glass of water on an empty stomach.     calcium-vitamin D 500-200 MG-UNIT tablet  Commonly known as:  OSCAL WITH D  Take 1 tablet by mouth daily.     furosemide 40 MG tablet  Commonly known as:  LASIX  Take 40 mg by mouth daily.     HYDROcodone-acetaminophen 5-325 MG tablet  Commonly known as:  NORCO  Take 1-2 tablets by mouth every 6 (six) hours as needed for moderate pain.     methocarbamol 500 MG tablet  Commonly known as:  ROBAXIN  Take 1 tablet (500 mg total) by mouth 3 (three)  times daily as needed.     pantoprazole 40 MG tablet  Commonly known as:  PROTONIX  Take 1 tablet (40 mg total) by mouth daily.     PRESCRIPTION MEDICATION  Take 1 tablet by mouth at bedtime. B12 med. Pt states it is 20 mg     traMADol 50 MG tablet  Commonly known as:  ULTRAM  Take 50 mg by mouth every 6 (six) hours as needed for moderate pain.     warfarin 5 MG tablet  Commonly known as:  COUMADIN  Take 1 tablet (5 mg total) by mouth daily. Take as directed per the pharmacist for INR target from 2.5-3.0 for 30 days post op        Diagnostic Studies: Dg Knee Left Port  07/15/2015  CLINICAL DATA:  Status post left knee arthroplasty EXAM: PORTABLE LEFT KNEE - 1-2 VIEW COMPARISON:  None. FINDINGS: Postoperative images show the prosthetic components to be well seated and well aligned. There is no acute fracture or evidence of an operative complication. IMPRESSION: Well-positioned left knee arthroplasty. Electronically Signed   By: Lajean Manes M.D.   On: 07/15/2015 15:09    Disposition: SNF rehab        Follow-up Information    Follow up with Orlandus Borowski,STEVEN R, MD. Call in 2 weeks.   Specialty:  Orthopedic Surgery   Why:  [phone]   Contact information:   [address] [place] [address] [phone]        Signed: Augustin Schooling 07/18/2015, 10:25 AM

## 2015-07-18 NOTE — Progress Notes (Signed)
Orthopedic Tech Progress Note Patient Details:  Samantha Ayala [birth year] [id number]  Patient ID: Samantha Ayala, female   DOB: [birth year], 80 y.o.   MRN: [id number] Pt wants to wait till after breakfast to get in cpm.   Karolee Stamps 07/18/2015, 5:37 AM

## 2015-07-18 NOTE — Discharge Instructions (Signed)
WBAT with the left leg  Do exercises every hour for 5 minutes  High fall risk  Keep the incision covered and clean and dry for one week, then ok to shower and get wet  Follow up with Dr Veverly Fells in two weeks  [phone]  Information on my medicine - Coumadin   (Warfarin)  This medication education was reviewed with me or my healthcare representative as part of my discharge preparation.    Why was Coumadin prescribed for you? Coumadin was prescribed for you because you have a blood clot or a medical condition that can cause an increased risk of forming blood clots. Blood clots can cause serious health problems by blocking the flow of blood to the heart, lung, or brain. Coumadin can prevent harmful blood clots from forming. As a reminder your indication for Coumadin is:   Blood Clot Prevention After Orthopedic Surgery  What test will check on my response to Coumadin? While on Coumadin (warfarin) you will need to have an INR test regularly to ensure that your dose is keeping you in the desired range. The INR (international normalized ratio) number is calculated from the result of the laboratory test called prothrombin time (PT).  If an INR APPOINTMENT HAS NOT ALREADY BEEN MADE FOR YOU please schedule an appointment to have this lab work done by your health care provider within 7 days. Your INR goal is usually a number between:  2 to 3 or your provider may give you a more narrow range like 2-2.5.  Ask your health care provider during an office visit what your goal INR is.  What  do you need to  know  About  COUMADIN? Take Coumadin (warfarin) exactly as prescribed by your healthcare provider about the same time each day.  DO NOT stop taking without talking to the doctor who prescribed the medication.  Stopping without other blood clot prevention medication to take the place of Coumadin may increase your risk of developing a new clot or stroke.  Get refills before you run out.  What do you do if  you miss a dose? If you miss a dose, take it as soon as you remember on the same day then continue your regularly scheduled regimen the next day.  Do not take two doses of Coumadin at the same time.  Important Safety Information A possible side effect of Coumadin (Warfarin) is an increased risk of bleeding. You should call your healthcare provider right away if you experience any of the following: ? Bleeding from an injury or your nose that does not stop. ? Unusual colored urine (red or dark brown) or unusual colored stools (red or black). ? Unusual bruising for unknown reasons. ? A serious fall or if you hit your head (even if there is no bleeding).  Some foods or medicines interact with Coumadin (warfarin) and might alter your response to warfarin. To help avoid this: ? Eat a balanced diet, maintaining a consistent amount of Vitamin K. ? Notify your provider about major diet changes you plan to make. ? Avoid alcohol or limit your intake to 1 drink for women and 2 drinks for men per day. (1 drink is 5 oz. wine, 12 oz. beer, or 1.5 oz. liquor.)  Make sure that ANY health care provider who prescribes medication for you knows that you are taking Coumadin (warfarin).  Also make sure the healthcare provider who is monitoring your Coumadin knows when you have started a new medication including herbals and non-prescription products.  Coumadin (Warfarin)  Major Drug Interactions  Increased Warfarin Effect Decreased Warfarin Effect  Alcohol (large quantities) Antibiotics (esp. Septra/Bactrim, Flagyl, Cipro) Amiodarone (Cordarone) Aspirin (ASA) Cimetidine (Tagamet) Megestrol (Megace) NSAIDs (ibuprofen, naproxen, etc.) Piroxicam (Feldene) Propafenone (Rythmol SR) Propranolol (Inderal) Isoniazid (INH) Posaconazole (Noxafil) Barbiturates (Phenobarbital) Carbamazepine (Tegretol) Chlordiazepoxide (Librium) Cholestyramine (Questran) Griseofulvin Oral Contraceptives Rifampin Sucralfate  (Carafate) Vitamin K   Coumadin (Warfarin) Major Herbal Interactions  Increased Warfarin Effect Decreased Warfarin Effect  Garlic Ginseng Ginkgo biloba Coenzyme Q10 Green tea St. Johns wort    Coumadin (Warfarin) FOOD Interactions  Eat a consistent number of servings per week of foods HIGH in Vitamin K (1 serving =  cup)  Collards (cooked, or boiled & drained) Kale (cooked, or boiled & drained) Mustard greens (cooked, or boiled & drained) Parsley *serving size only =  cup Spinach (cooked, or boiled & drained) Swiss chard (cooked, or boiled & drained) Turnip greens (cooked, or boiled & drained)  Eat a consistent number of servings per week of foods MEDIUM-HIGH in Vitamin K (1 serving = 1 cup)  Asparagus (cooked, or boiled & drained) Broccoli (cooked, boiled & drained, or raw & chopped) Brussel sprouts (cooked, or boiled & drained) *serving size only =  cup Lettuce, raw (green leaf, endive, romaine) Spinach, raw Turnip greens, raw & chopped   These websites have more information on Coumadin (warfarin):  FailFactory.se; VeganReport.com.au;

## 2015-07-18 NOTE — Progress Notes (Signed)
Physical Therapy Treatment Patient Details Name: Samantha Ayala MRN: [id number] DOB: [birth year] Today's Date: 07/18/2015    History of Present Illness 80 y.o. female admitted for elective TKR left; PMH significant for OA, toe amputation    PT Comments    Pt progressing towards physical therapy goals. Was able to perform transfers and ambulation with min guard to supervision for safety, however overall tolerance for functional activity remains low. Pt requires continued cues for technique during gait training, and VC's for safety during transfers. Pt anticipates d/c to SNF this afternoon.   Follow Up Recommendations  SNF     Equipment Recommendations  3in1 (PT);Rolling walker with 5" wheels    Recommendations for Other Services       Precautions / Restrictions Precautions Precautions: Knee;Fall Precaution Booklet Issued: No Precaution Comments: Reviewed not placing pillow, ice pack or other object under L knee Restrictions Weight Bearing Restrictions: Yes LLE Weight Bearing: Weight bearing as tolerated    Mobility  Bed Mobility               General bed mobility comments: Pt received exiting bathroom  Transfers Overall transfer level: Needs assistance Equipment used: Rolling walker (2 wheeled) Transfers: Sit to/from Stand Sit to Stand: Supervision         General transfer comment: Supervision for safety. Pt was cued for hand placement on seated surface for safety.   Ambulation/Gait Ambulation/Gait assistance: Min guard;Supervision Ambulation Distance (Feet): 100 Feet Assistive device: Rolling walker (2 wheeled) Gait Pattern/deviations: Step-through pattern;Decreased stride length;Trunk flexed Gait velocity: Decreased Gait velocity interpretation: Below normal speed for age/gender General Gait Details: VC's for improved posture and sequencing/safety with the RW. Grossly at min guard progressing to supervision by end of gait training.   Stairs             Wheelchair Mobility    Modified Rankin (Stroke Patients Only)       Balance Overall balance assessment: Needs assistance Sitting-balance support: Feet supported;No upper extremity supported Sitting balance-Leahy Scale: Good     Standing balance support: Bilateral upper extremity supported;During functional activity Standing balance-Leahy Scale: Poor Standing balance comment: Reliant on RW for support.                     Cognition Arousal/Alertness: Awake/alert Behavior During Therapy: WFL for tasks assessed/performed Overall Cognitive Status: Within Functional Limits for tasks assessed                      Exercises Total Joint Exercises Ankle Circles/Pumps: 10 reps Quad Sets: 10 reps Heel Slides: 10 reps Hip ABduction/ADduction: 10 reps Long Arc Quad: 10 reps (limited range) Goniometric ROM: 68    General Comments        Pertinent Vitals/Pain Pain Assessment: Faces Faces Pain Scale: Hurts even more Pain Location: knee during exercise Pain Descriptors / Indicators: Operative site guarding;Sore Pain Intervention(s): Limited activity within patient's tolerance;Monitored during session;Repositioned    Home Living Family/patient expects to be discharged to:: Skilled nursing facility                    Prior Function            PT Goals (current goals can now be found in the care plan section) Acute Rehab PT Goals PT Goal Formulation: With patient/family Time For Goal Achievement: 07/23/15 Potential to Achieve Goals: Good Progress towards PT goals: Progressing toward goals    Frequency  7X/week  PT Plan Current plan remains appropriate    Co-evaluation             End of Session Equipment Utilized During Treatment: Gait belt Activity Tolerance: Patient tolerated treatment well;Patient limited by pain;Patient limited by fatigue Patient left: with call bell/phone within reach;in bed     Time: 1101-1130 PT Time  Calculation (min) (ACUTE ONLY): 29 min  Charges:  $Gait Training: 8-22 mins $Therapeutic Exercise: 8-22 mins                    G Codes:      Rolinda Roan Aug 04, 2015, 12:45 PM   Rolinda Roan, PT, DPT Acute Rehabilitation Services Pager: [phone]

## 2015-07-19 ENCOUNTER — Encounter (HOSPITAL_COMMUNITY): Payer: Self-pay | Admitting: Orthopedic Surgery

## 2015-07-19 NOTE — Care Management Important Message (Signed)
Important Message  Patient Details  Name: Samantha Ayala MRN: [id number] Date of Birth: Nov 28, 1935   Medicare Important Message Given:  Yes    Bert Ptacek, Leroy Sea 07/19/2015, 1:40 PM

## 2015-08-02 LAB — PROTIME-INR: INR: 2.1 — AB (ref 0.9–1.1)

## 2015-08-10 ENCOUNTER — Ambulatory Visit (INDEPENDENT_AMBULATORY_CARE_PROVIDER_SITE_OTHER): Payer: Medicare HMO | Admitting: Gastroenterology

## 2015-08-10 ENCOUNTER — Encounter: Payer: Self-pay | Admitting: Gastroenterology

## 2015-08-10 VITALS — BP 106/70 | HR 100 | Ht 61.75 in | Wt 188.2 lb

## 2015-08-10 DIAGNOSIS — K2270 Barrett's esophagus without dysplasia: Secondary | ICD-10-CM

## 2015-08-10 NOTE — Patient Instructions (Signed)
You have been scheduled for an endoscopy. Please follow written instructions given to you at your visit today. If you use inhalers (even only as needed), please bring them with you on the day of your procedure. Your physician has requested that you go to www.startemmi.com and enter the access code given to you at your visit today. This web site gives a general overview about your procedure. However, you should still follow specific instructions given to you by our office regarding your preparation for the procedure.  If you are age 83 or older, your body mass index should be between 23-30. Your Body mass index is 34.73 kg/(m^2). If this is out of the aforementioned range listed, please consider follow up with your Primary Care Provider.  If you are age 8 or younger, your body mass index should be between 19-25. Your Body mass index is 34.73 kg/(m^2). If this is out of the aformentioned range listed, please consider follow up with your Primary Care Provider.

## 2015-08-10 NOTE — Progress Notes (Signed)
[facility] Progress Note  Chief Complaint: Barrett's esophagus  Subjective History:  Samantha Ayala is here for follow-up due to a history of Barrett's esophagus. She had a 7 cm length of Barrett's with no dysplasia discovered by Dr. Sharlett Iles in March 2013. She had a prior fundoplication that was intact on that last EGD. Shay denies symptoms of reflux, dysphagia odynophagia vomiting or weight loss. She is recovering from a recent left knee replacement and is on Coumadin until next week.  ROS: Cardiovascular:  no chest pain Respiratory: no dyspnea Positive for right knee pain after recent surgery The patient's Past Medical, Family and Social History were reviewed and are on file in the EMR.  Objective:  Med list reviewed  Vital signs in last 24 hrs: Filed Vitals:   08/10/15 0956  BP: 106/70  Pulse: 100    Physical Exam   HEENT: sclera anicteric, oral mucosa moist without lesions  Neck: supple, no thyromegaly, JVD or lymphadenopathy  Cardiac: RRR without murmurs, S1S2 heard, Mild left knee edema  Pulm: clear to auscultation bilaterally, normal RR and effort noted  Abdomen: soft, no tenderness, with active bowel sounds. No guarding or palpable hepatosplenomegaly.  Skin; warm and dry, no jaundice or rash   @ASSESSMENTPLANBEGIN @ Assessment: Encounter Diagnosis  Name Primary?  . Barrett's esophagus without dysplasia Yes      Plan: Upper endoscopy for Barrett's surveillance. She would like to wait until later in the summer.   Nelida Meuse III

## 2015-10-21 ENCOUNTER — Encounter: Payer: Self-pay | Admitting: Gastroenterology

## 2015-11-04 ENCOUNTER — Encounter: Payer: Self-pay | Admitting: Gastroenterology

## 2015-11-04 ENCOUNTER — Ambulatory Visit (AMBULATORY_SURGERY_CENTER): Payer: Medicare HMO | Admitting: Gastroenterology

## 2015-11-04 VITALS — BP 130/61 | HR 77 | Temp 97.3°F | Resp 16 | Ht 61.75 in | Wt 188.0 lb

## 2015-11-04 DIAGNOSIS — K2270 Barrett's esophagus without dysplasia: Secondary | ICD-10-CM

## 2015-11-04 MED ORDER — SODIUM CHLORIDE 0.9 % IV SOLN: 500.0000 mL | INTRAVENOUS | Status: DC

## 2015-11-04 NOTE — Patient Instructions (Signed)
YOU HAD AN ENDOSCOPIC PROCEDURE TODAY AT THE [facility]:   Refer to the procedure report that was given to you for any specific questions about what was found during the examination.  If the procedure report does not answer your questions, please call your gastroenterologist to clarify.  If you requested that your care partner not be given the details of your procedure findings, then the procedure report has been included in a sealed envelope for you to review at your convenience later.  YOU SHOULD EXPECT: Some feelings of bloating in the abdomen. Passage of more gas than usual.  Walking can help get rid of the air that was put into your GI tract during the procedure and reduce the bloating. If you had a lower endoscopy (such as a colonoscopy or flexible sigmoidoscopy) you may notice spotting of blood in your stool or on the toilet paper. If you underwent a bowel prep for your procedure, you may not have a normal bowel movement for a few days.  Please Note:  You might notice some irritation and congestion in your nose or some drainage.  This is from the oxygen used during your procedure.  There is no need for concern and it should clear up in a day or so.  SYMPTOMS TO REPORT IMMEDIATELY:   Following upper endoscopy (EGD)  Vomiting of blood or coffee ground material  New chest pain or pain under the shoulder blades  Painful or persistently difficult swallowing  New shortness of breath  Fever of 100F or higher  Black, tarry-looking stools  For urgent or emergent issues, a gastroenterologist can be reached at any hour by calling [phone].   DIET:  We do recommend a small meal at first, but then you may proceed to your regular diet.  Drink plenty of fluids but you should avoid alcoholic beverages for 24 hours.  ACTIVITY:  You should plan to take it easy for the rest of today and you should NOT DRIVE or use heavy machinery until tomorrow (because of the sedation medicines used  during the test).    FOLLOW UP: Our staff will call the number listed on your records the next business day following your procedure to check on you and address any questions or concerns that you may have regarding the information given to you following your procedure. If we do not reach you, we will leave a message.  However, if you are feeling well and you are not experiencing any problems, there is no need to return our call.  We will assume that you have returned to your regular daily activities without incident.  If any biopsies were taken you will be contacted by phone or by letter within the next 1-3 weeks.  Please call us at [phone] if you have not heard about the biopsies in 3 weeks.    SIGNATURES/CONFIDENTIALITY: You and/or your care partner have signed paperwork which will be entered into your electronic medical record.  These signatures attest to the fact that that the information above on your After Visit Summary has been reviewed and is understood.  Full responsibility of the confidentiality of this discharge information lies with you and/or your care-partner. 

## 2015-11-04 NOTE — Progress Notes (Signed)
A and O x3. Report to RN. Tolerated MAC anesthesia well.Teeth unchanged after procedure. 

## 2015-11-04 NOTE — Progress Notes (Signed)
No egg or soy allergy known to patient  No issues with past sedation with any surgeries  or procedures, no intubation problems  No diet pills per patient No home 02 use per patient  No blood thinners per patient  Pt denies issues with constipation  No A fib or A flutter   

## 2015-11-04 NOTE — Op Note (Signed)
[facility] Patient Name: Samantha Ayala Procedure Date: 11/04/2015 9:48 AM MRN: [id number] Endoscopist: Mallie Mussel L. Loletha Carrow , MD Age: 80 Referring MD:  Date of Birth: [birth year] Gender: Female Account #: [id number] Procedure:                Upper GI endoscopy Indications:              Surveillance for malignancy due to personal history                            of Barrett's esophagus Medicines:                Monitored Anesthesia Care Procedure:                Pre-Anesthesia Assessment:                           - Prior to the procedure, a History and Physical                            was performed, and patient medications and                            allergies were reviewed. The patient's tolerance of                            previous anesthesia was also reviewed. The risks                            and benefits of the procedure and the sedation                            options and risks were discussed with the patient.                            All questions were answered, and informed consent                            was obtained. Prior Anticoagulants: The patient has                            taken no previous anticoagulant or antiplatelet                            agents. ASA Grade Assessment: II - A patient with                            mild systemic disease. After reviewing the risks                            and benefits, the patient was deemed in                            satisfactory condition to undergo the procedure.  After obtaining informed consent, the endoscope was                            passed under direct vision. Throughout the                            procedure, the patient's blood pressure, pulse, and                            oxygen saturations were monitored continuously. The                            Model GIF-HQ190 [id number]) scope was introduced                            through the mouth, and advanced  to the second part                            of duodenum. The upper GI endoscopy was                            accomplished without difficulty. The patient                            tolerated the procedure well. Scope In: Scope Out: Findings:                 There were esophageal mucosal changes secondary to                            established long-segment Barrett's disease present                            in the lower third of the esophagus. It was                            circumferential except for a few small islands on                            the proximal aspect. The maximum longitudinal                            extent of these mucosal changes was 7 cm in length.                            Mucosa was biopsied with a cold forceps for                            histology in 4 quadrants at intervals of 1-2 cm                            from 25 to 32 cm from the incisors. A total of 5  specimen bottles were sent to pathology.                           There were no raised or other suspicious lesions                            within the area of Barrett's.                           A 4 cm hiatal hernia was present (32-36 cm from the                            incisors).                           Multiple semi-sessile fundic gland polyps were                            found in the gastric fundus. Some had overlying                            erosions with scant heme.                           Evidence of a fundoplication was found in the                            cardia. The wrap appeared intact.                           The examined duodenum was normal. Complications:            No immediate complications. Estimated Blood Loss:     Estimated blood loss: none. Impression:               - Esophageal mucosal changes secondary to                            established long-segment Barrett's disease.                            Biopsied.                            - 4 cm hiatal hernia.                           - Multiple fundic gland polyps.                           - A fundoplication was found. The wrap appears                            intact.                           - Normal examined duodenum. Recommendation:           -  Patient has a contact number available for                            emergencies. The signs and symptoms of potential                            delayed complications were discussed with the                            patient. Return to normal activities tomorrow.                            Written discharge instructions were provided to the                            patient.                           - Resume previous diet.                           - Continue present medications.                           - Await pathology results.                           - Repeat upper endoscopy after studies are complete                            for surveillance based on pathology results. Henry L. Loletha Carrow, MD 11/04/2015 10:18:18 AM This report has been signed electronically.

## 2015-11-04 NOTE — Progress Notes (Signed)
Called to room to assist during endoscopic procedure.  Patient ID and intended procedure confirmed with present staff. Received instructions for my participation in the procedure from the performing physician.  

## 2015-11-07 ENCOUNTER — Telehealth: Payer: Self-pay | Admitting: *Deleted

## 2015-11-07 NOTE — Telephone Encounter (Signed)
  Follow up Call-  Call back number 11/04/2015  Post procedure Call Back phone  # [phone]  Permission to leave phone message Yes  Some recent data might be hidden     Patient questions:  Do you have a fever, pain , or abdominal swelling? No. Pain Score  0 *  Have you tolerated food without any problems? Yes.    Have you been able to return to your normal activities? Yes.    Do you have any questions about your discharge instructions: Diet   No. Medications  No. Follow up visit  No.  Do you have questions or concerns about your Care? No.  Actions: * If pain score is 4 or above: No action needed, pain <4.

## 2015-11-10 ENCOUNTER — Encounter: Payer: Self-pay | Admitting: Gastroenterology

## 2016-03-09 ENCOUNTER — Encounter: Payer: Self-pay | Admitting: Gastroenterology

## 2016-11-16 ENCOUNTER — Ambulatory Visit (INDEPENDENT_AMBULATORY_CARE_PROVIDER_SITE_OTHER): Payer: Medicare Other | Admitting: Physician Assistant

## 2016-11-16 ENCOUNTER — Encounter (INDEPENDENT_AMBULATORY_CARE_PROVIDER_SITE_OTHER): Payer: Self-pay

## 2016-11-16 ENCOUNTER — Encounter: Payer: Self-pay | Admitting: Physician Assistant

## 2016-11-16 VITALS — BP 128/60 | HR 76 | Ht 62.75 in | Wt 204.0 lb

## 2016-11-16 DIAGNOSIS — K2270 Barrett's esophagus without dysplasia: Secondary | ICD-10-CM

## 2016-11-16 DIAGNOSIS — R1310 Dysphagia, unspecified: Secondary | ICD-10-CM

## 2016-11-16 NOTE — Progress Notes (Signed)
Chief Complaint: Dysphagia  HPI:  [name] is a 81 year old Caucasian female with a past medical history as listed below including Barrett's esophagus and reflux, who presents to clinic today with a complaint of dysphagia.   Please recall patient has recently followed with Dr. Loletha Carrow. Her last EGD was performed 11/04/15 with findings of esophageal mucosal changes secondary to established long segment Barrett's disease, 4 cm hiatal hernia, multiple fundic gland polyps and evidence of fundoplication with a intact wrap as well as normal examined duodenum. Pathology revealed findings consistent with Barrett's esophagus and it was recommended patient have follow-up in 2 years.   Today, the patient presents to clinic and explains that about a month ago she starting having trouble swallowing. She tells me this can happen just "any time". When the patient is out to eat she avoids eating any "hard foods", because she feels as though she may have a problem. Various things have gotten stuck in her throat including meats and breads. The patient has been using meat tenderizer mixed in water which she drinks if she feels as though something is getting stuck. She tells me this either makes it "come back up or go back down". She was told to do this by one of her other doctors. Patient also describes when she swallows she feels as though it is "tight in one spot".She denies any heartburn or reflux or abdominal discomfort.   Patient's social history is positive for being widowed in 1996, she has 1 daughter.   Patient denies fever, chills, blood in her stool, melena, weight loss, fatigue, anorexia, nausea, vomiting or symptoms that awaken her at night.  Past Medical History:  Diagnosis Date  . Angiodysplasia of intestine with hemorrhage   . Arthritis   . 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 Barrett esophagus   . Cataract    removed both eyes  . Esophageal reflux   . Gastric polyp 06/20/2006     HYPERPLASTIC POLYP  . GERD (gastroesophageal reflux disease)   . Hiatal hernia   . Osteoporosis   . Personal history of colonic polyps   . Stricture and stenosis of esophagus   . Unspecified hemorrhoids without mention of complication     Past Surgical History:  Procedure Laterality Date  . CARPAL TUNNEL RELEASE Left    with cyst removal  . CATARACT EXTRACTION Bilateral 2010  . COLONOSCOPY    . HIATAL HERNIA REPAIR    . NISSEN FUNDOPLICATION  0923  . POLYPECTOMY    . TOE AMPUTATION Right 2006  . TOTAL KNEE ARTHROPLASTY Left 07/15/2015   Procedure: LEFT TOTAL KNEE ARTHROPLASTY;  Surgeon: Netta Cedars, MD;  Location: [facility];  Service: Orthopedics;  Laterality: Left;  . UPPER GASTROINTESTINAL ENDOSCOPY      Current Outpatient Prescriptions  Medication Sig Dispense Refill  . alendronate (FOSAMAX) 70 MG tablet Take 70 mg by mouth every 7 (seven) days. Take with a full glass of water on an empty stomach.     . calcium-vitamin D (OSCAL WITH D) 500-200 MG-UNIT per tablet Take 1 tablet by mouth daily.      . furosemide (LASIX) 40 MG tablet Take 40 mg by mouth daily.      Marland Kitchen HYDROcodone-acetaminophen (NORCO) 5-325 MG tablet Take 1-2 tablets by mouth every 6 (six) hours as needed for moderate pain. 60 tablet 0  . methocarbamol (ROBAXIN) 500 MG tablet Take 1 tablet (500 mg total) by mouth 3 (three) times daily as needed.  60 tablet 1  . pantoprazole (PROTONIX) 40 MG tablet Take 1 tablet (40 mg total) by mouth daily. 30 tablet 11  . PRESCRIPTION MEDICATION Take 1 tablet by mouth at bedtime. B12 med. Pt states it is 20 mg     Current Facility-Administered Medications  Medication Dose Route Frequency Provider Last Rate Last Dose  . 0.9 %  sodium chloride infusion  500 mL Intravenous Continuous Doran Stabler, MD        Allergies as of 11/16/2016  . (No Known Allergies)    Family History  Problem Relation Age of Onset  . Stomach cancer Mother   . Diabetes Sister   . Colon cancer Neg  Hx   . Colon polyps Neg Hx   . Esophageal cancer Neg Hx   . Rectal cancer Neg Hx     Social History   Social History  . Marital status: Widowed    Spouse name: N/A  . Number of children: 2  . Years of education: N/A   Occupational History  . Retired    Social History Main Topics  . Smoking status: Never Smoker  . Smokeless tobacco: Never Used  . Alcohol use No  . Drug use: No  . Sexual activity: Not on file   Other Topics Concern  . Not on file   Social History Narrative  . No narrative on file    Review of Systems:    Constitutional: No weight loss, fever or chills Cardiovascular: No chest pain Respiratory: No SOB  Gastrointestinal: See HPI and otherwise negative   Physical Exam:  Vital signs: BP 128/60   Pulse 76   Ht 5' 2.75" (1.594 m)   Wt 204 lb (92.5 kg)   BMI 36.43 kg/m   Constitutional:   Pleasant Elderly Caucasian female appears to be in NAD, Well developed, Well nourished, alert and cooperative Head:  Normocephalic and atraumatic. Eyes:   PEERL, EOMI. No icterus. Conjunctiva pink. Ears:  Normal auditory acuity. Neck:  Supple Throat: Oral cavity and pharynx without inflammation, swelling or lesion.  Respiratory: Respirations even and unlabored. Lungs clear to auscultation bilaterally.   No wheezes, crackles, or rhonchi.  Cardiovascular: Normal S1, S2. No MRG. Regular rate and rhythm. No peripheral edema, cyanosis or pallor.  Gastrointestinal:  Soft, nondistended, nontender. No rebound or guarding. Normal bowel sounds. No appreciable masses or hepatomegaly. Psychiatric:  Demonstrates good judgement and reason without abnormal affect or behaviors.  No recent labs or imaging.  Assessment: 1. Dysphagia: Symptoms over the past month, somewhat unpredictable but only to solid foods at this point, last EGD 2017 with continued Barrett's; Most likely stricture vs ring vs web vs mass 2. History of Barrett's esophagus  Plan: 1. Scheduled patient for an EGD  in the [facility] with Dr. Loletha Carrow. Did discuss risks, benefits, limitations and alternatives and the patient agrees to proceed. 2. Recommend patient continue her Pantoprazole 40 mg once daily, 30-60 minutes before breakfast. 3. Reviewed anti-dysphagia measures including taking small bites, drinking sips of water between bites, avoiding distraction while eating and the chin tuck technique. 4. Patient to return to clinic per recommendations from Dr. Loletha Carrow after time of procedure  Ellouise Newer, PA-C [facility] 11/16/2016, 9:11 AM  Cc: Leonard Downing, *

## 2016-11-16 NOTE — Patient Instructions (Addendum)
You have been scheduled for an endoscopy. Please follow written instructions given to you at your visit today. If you use inhalers (even only as needed), please bring them with you on the day of your procedure. Your physician has requested that you go to www.startemmi.com and enter the access code given to you at your visit today. This web site gives a general overview about your procedure. However, you should still follow specific instructions given to you by our office regarding your preparation for the procedure.  Continue Pantoprazole.

## 2016-11-19 NOTE — Progress Notes (Signed)
Thank you for sending this case to me. I have reviewed the entire note, and the outlined plan seems appropriate.   Jadzia Ibsen Danis, MD  

## 2016-11-20 ENCOUNTER — Ambulatory Visit (AMBULATORY_SURGERY_CENTER): Payer: Medicare Other | Admitting: Gastroenterology

## 2016-11-20 ENCOUNTER — Encounter: Payer: Self-pay | Admitting: Gastroenterology

## 2016-11-20 VITALS — BP 146/74 | HR 60 | Temp 98.6°F | Resp 12 | Ht 62.75 in | Wt 204.0 lb

## 2016-11-20 DIAGNOSIS — R1319 Other dysphagia: Secondary | ICD-10-CM

## 2016-11-20 DIAGNOSIS — R1310 Dysphagia, unspecified: Secondary | ICD-10-CM | POA: Diagnosis present

## 2016-11-20 DIAGNOSIS — K2270 Barrett's esophagus without dysplasia: Secondary | ICD-10-CM

## 2016-11-20 MED ORDER — SODIUM CHLORIDE 0.9 % IV SOLN: 500.0000 mL | INTRAVENOUS | Status: DC

## 2016-11-20 NOTE — Patient Instructions (Signed)
YOU HAD AN ENDOSCOPIC PROCEDURE TODAY AT [facility]:   Refer to the procedure report that was given to you for any specific questions about what was found during the examination.  If the procedure report does not answer your questions, please call your gastroenterologist to clarify.  If you requested that your care partner not be given the details of your procedure findings, then the procedure report has been included in a sealed envelope for you to review at your convenience later.  YOU SHOULD EXPECT: Some feelings of bloating in the abdomen. Passage of more gas than usual.  Walking can help get rid of the air that was put into your GI tract during the procedure and reduce the bloating.   Please Note:  You might notice some irritation and congestion in your nose or some drainage.  This is from the oxygen used during your procedure.  There is no need for concern and it should clear up in a day or so.  SYMPTOMS TO REPORT IMMEDIATELY:    Following upper endoscopy (EGD)  Vomiting of blood or coffee ground material  New chest pain or pain under the shoulder blades  Painful or persistently difficult swallowing  New shortness of breath  Fever of 100F or higher  Black, tarry-looking stools  For urgent or emergent issues, a gastroenterologist can be reached at any hour by calling [phone].   DIET:  We do recommend a small meal at first, but then you may proceed to your regular diet.  Drink plenty of fluids but you should avoid alcoholic beverages for 24 hours.  ACTIVITY:  You should plan to take it easy for the rest of today and you should NOT DRIVE or use heavy machinery until tomorrow (because of the sedation medicines used during the test).    FOLLOW UP: Our staff will call the number listed on your records the next business day following your procedure to check on you and address any questions or concerns that you may have regarding the information given to you  following your procedure. If we do not reach you, we will leave a message.  However, if you are feeling well and you are not experiencing any problems, there is no need to return our call.  We will assume that you have returned to your regular daily activities without incident.  If any biopsies were taken you will be contacted by phone or by letter within the next 1-3 weeks.  Please call us at [phone] if you have not heard about the biopsies in 3 weeks.    SIGNATURES/CONFIDENTIALITY: You and/or your care partner have signed paperwork which will be entered into your electronic medical record.  These signatures attest to the fact that that the information above on your After Visit Summary has been reviewed and is understood.  Full responsibility of the confidentiality of this discharge information lies with you and/or your care-partner.  Read all of the handouts given to you by your recovery room nurse.  Be sure to chew your food well!

## 2016-11-20 NOTE — Op Note (Addendum)
[place] Patient Name: Lonnie Rosado Procedure Date: 11/20/2016 4:10 PM MRN: [id number] Endoscopist: Mallie Mussel L. Loletha Carrow , MD Age: 81 Referring MD:  Date of Birth: [birth year] Gender: Female Account #: [id number] Procedure:                Upper GI endoscopy Indications:              Dysphagia Medicines:                Monitored Anesthesia Care Procedure:                Pre-Anesthesia Assessment:                           - Prior to the procedure, a History and Physical                            was performed, and patient medications and                            allergies were reviewed. The patient's tolerance of                            previous anesthesia was also reviewed. The risks                            and benefits of the procedure and the sedation                            options and risks were discussed with the patient.                            All questions were answered, and informed consent                            was obtained. Prior Anticoagulants: The patient has                            taken no previous anticoagulant or antiplatelet                            agents. ASA Grade Assessment: II - A patient with                            mild systemic disease. After reviewing the risks                            and benefits, the patient was deemed in                            satisfactory condition to undergo the procedure.                           After obtaining informed consent, the endoscope was  passed under direct vision. Throughout the                            procedure, the patient's blood pressure, pulse, and                            oxygen saturations were monitored continuously. The                            Endoscope was introduced through the mouth, and                            advanced to the second part of duodenum. The upper                            GI endoscopy was accomplished without  difficulty.                            The patient tolerated the procedure well. Scope In: Scope Out: Findings:                 The larynx was normal.                           There were esophageal mucosal changes secondary to                            established long-segment Barrett's disease present                            in the lower third of the esophagus. The maximum                            longitudinal extent of these mucosal changes was 4                            cm in length. There were no raised or otherwise                            suspicious areas within the Barrett's on either WL                            or NBI.                           A medium-sized hiatal hernia was present.                           Evidence of a fundoplication was found in the                            cardia. The wrap appeared intact. This was                            traversed.  Multiple small sessile polyps were found in the                            gastric fundus and in the gastric body.                           The cardia and gastric fundus were normal on                            retroflexion.                           The examined duodenum was normal. Complications:            No immediate complications. Estimated Blood Loss:     Estimated blood loss: none. Impression:               - Normal larynx.                           - Esophageal mucosal changes secondary to                            established long-segment Barrett's disease.                           - Medium-sized hiatal hernia.                           - A fundoplication was found. The wrap appears                            intact.                           - Multiple gastric polyps. These are benign and the                            result of chronic PPI use.                           - Normal examined duodenum.                           - No specimens collected.                            The dysphagia is most likely from presbyesophagus                            in the setting of prior fundoplication. Recommendation:           - Patient has a contact number available for                            emergencies. The signs and symptoms of potential  delayed complications were discussed with the                            patient. Return to normal activities tomorrow.                            Written discharge instructions were provided to the                            patient.                           - Resume previous diet. Be cautious when eating,                            cutting and chewing food well. Sips of liquid after                            every couple of bites.                           - Continue present medications. Grissel Tyrell L. Loletha Carrow, MD 11/20/2016 4:33:47 PM This report has been signed electronically.

## 2016-11-20 NOTE — Progress Notes (Signed)
Spontaneous respirations throughout. VSS. Resting comfortably. To PACU on room air. Report to  RN. 

## 2016-11-21 ENCOUNTER — Telehealth: Payer: Self-pay | Admitting: *Deleted

## 2016-11-21 NOTE — Telephone Encounter (Signed)
  Follow up Call-  Call back number 11/20/2016 11/04/2015  Post procedure Call Back phone  # [phone]  Permission to leave phone message Yes Yes  Some recent data might be hidden     Patient questions:  Do you have a fever, pain , or abdominal swelling? No. Pain Score  0 *  Have you tolerated food without any problems? Yes.    Have you been able to return to your normal activities? Yes.    Do you have any questions about your discharge instructions: Diet   No. Medications  No. Follow up visit  No.  Do you have questions or concerns about your Care? No.  Actions: * If pain score is 4 or above: No action needed, pain <4.

## 2016-12-05 ENCOUNTER — Other Ambulatory Visit: Payer: Self-pay

## 2017-02-18 ENCOUNTER — Other Ambulatory Visit: Payer: Self-pay | Admitting: Nurse Practitioner

## 2017-02-18 DIAGNOSIS — M810 Age-related osteoporosis without current pathological fracture: Secondary | ICD-10-CM

## 2017-12-10 ENCOUNTER — Encounter: Payer: Self-pay | Admitting: Gastroenterology

## 2018-02-03 ENCOUNTER — Other Ambulatory Visit: Payer: Self-pay

## 2019-05-28 ENCOUNTER — Encounter (HOSPITAL_COMMUNITY): Payer: Self-pay | Admitting: Emergency Medicine

## 2019-05-28 ENCOUNTER — Emergency Department (HOSPITAL_COMMUNITY): Payer: Medicare Other

## 2019-05-28 ENCOUNTER — Emergency Department (HOSPITAL_COMMUNITY)
Admission: EM | Admit: 2019-05-28 | Discharge: 2019-05-28 | Disposition: A | Discharge status: Home / Self Care | Payer: Medicare Other | Attending: Emergency Medicine | Admitting: Emergency Medicine

## 2019-05-28 ENCOUNTER — Other Ambulatory Visit: Payer: Self-pay

## 2019-05-28 DIAGNOSIS — Z79899 Other long term (current) drug therapy: Secondary | ICD-10-CM | POA: Diagnosis not present

## 2019-05-28 DIAGNOSIS — R0789 Other chest pain: Secondary | ICD-10-CM | POA: Diagnosis not present

## 2019-05-28 DIAGNOSIS — R0602 Shortness of breath: Secondary | ICD-10-CM | POA: Insufficient documentation

## 2019-05-28 DIAGNOSIS — Z20822 Contact with and (suspected) exposure to covid-19: Secondary | ICD-10-CM | POA: Diagnosis not present

## 2019-05-28 LAB — BASIC METABOLIC PANEL
Anion gap: 12 (ref 5–15)
BUN: 17 mg/dL (ref 8–23)
CO2: 24 mmol/L (ref 22–32)
Calcium: 8.5 mg/dL — ABNORMAL LOW (ref 8.9–10.3)
Chloride: 102 mmol/L (ref 98–111)
Creatinine, Ser: 0.87 mg/dL (ref 0.44–1.00)
GFR calc Af Amer: >60 mL/min (ref >60)
GFR calc non Af Amer: >60 mL/min (ref >60)
Glucose, Bld: 119 mg/dL — ABNORMAL HIGH (ref 70–99)
Potassium: 3.4 mmol/L — ABNORMAL LOW (ref 3.5–5.1)
Sodium: 138 mmol/L (ref 135–145)

## 2019-05-28 LAB — CBC WITH DIFFERENTIAL/PLATELET
Abs Immature Granulocytes: 0 10*3/uL (ref 0.00–0.07)
Basophils Absolute: 0 10*3/uL (ref 0.0–0.1)
Basophils Relative: 1 %
Eosinophils Absolute: 0 10*3/uL (ref 0.0–0.5)
Eosinophils Relative: 0 %
HCT: 41 % (ref 36.0–46.0)
Hemoglobin: 13.8 g/dL (ref 12.0–15.0)
Immature Granulocytes: 0 %
Lymphocytes Relative: 32 %
Lymphs Abs: 0.6 10*3/uL — ABNORMAL LOW (ref 0.7–4.0)
MCH: 28.9 pg (ref 26.0–34.0)
MCHC: 33.7 g/dL (ref 30.0–36.0)
MCV: 85.8 fL (ref 80.0–100.0)
Monocytes Absolute: 0.2 10*3/uL (ref 0.1–1.0)
Monocytes Relative: 10 %
Neutro Abs: 1.1 10*3/uL — ABNORMAL LOW (ref 1.7–7.7)
Neutrophils Relative %: 57 %
Platelets: 62 10*3/uL — ABNORMAL LOW (ref 150–400)
RBC: 4.78 MIL/uL (ref 3.87–5.11)
RDW: 13.5 % (ref 11.5–15.5)
WBC: 2 10*3/uL — ABNORMAL LOW (ref 4.0–10.5)
nRBC: 0 % (ref 0.0–0.2)

## 2019-05-28 LAB — D-DIMER, QUANTITATIVE: D-Dimer, Quant: 2.42 ug/mL-FEU — ABNORMAL HIGH (ref 0.00–0.50)

## 2019-05-28 LAB — BRAIN NATRIURETIC PEPTIDE: B Natriuretic Peptide: 70.3 pg/mL (ref 0.0–100.0)

## 2019-05-28 LAB — POC SARS CORONAVIRUS 2 AG -  ED: SARS Coronavirus 2 Ag: NEGATIVE

## 2019-05-28 LAB — TROPONIN I (HIGH SENSITIVITY)
Troponin I (High Sensitivity): 6 ng/L (ref <18)
Troponin I (High Sensitivity): 7 ng/L (ref <18)

## 2019-05-28 MED ORDER — POTASSIUM CHLORIDE CRYS ER 20 MEQ PO TBCR
20.0000 meq | EXTENDED_RELEASE_TABLET | Freq: Once | ORAL | Status: AC
  Administered 2019-05-28: 15:00:00 20 meq via ORAL
  Filled 2019-05-28: qty 1

## 2019-05-28 MED ORDER — ASPIRIN 81 MG PO CHEW
324.0000 mg | CHEWABLE_TABLET | Freq: Once | ORAL | Status: AC
  Administered 2019-05-28: 324 mg via ORAL
  Filled 2019-05-28: qty 4

## 2019-05-28 MED ORDER — IOHEXOL 350 MG/ML SOLN
80.0000 mL | Freq: Once | INTRAVENOUS | Status: AC | PRN
  Administered 2019-05-28: 13:00:00 80 mL via INTRAVENOUS

## 2019-05-28 NOTE — ED Triage Notes (Signed)
Pt arrives ambulatory to ED after driving herself here for sob that started 3 days ago. Pt very short of breath in triage after walking into ED. Pt placed in wheelchair, pt denies any CP, fever or cough. Pt states she has noticed episodes of "shaking".

## 2019-05-28 NOTE — ED Notes (Signed)
Pt ambulated in the room with oxygen saturation maintaining 97-100%, pts work of breathing did increase during ambulation.

## 2019-05-28 NOTE — Discharge Instructions (Signed)
If you develop recurrent, continued, or worsening chest pain, shortness of breath, fever, vomiting, abdominal or back pain, or any other new/concerning symptoms then return to the ER for evaluation.  

## 2019-05-28 NOTE — ED Provider Notes (Signed)
[facility] EMERGENCY DEPARTMENT Provider Note   CSN: [id number] Arrival date & time: 05/28/19  1017     History Chief Complaint  Patient presents with  . Shortness of Breath    Samantha Ayala is a 84 y.o. female.  HPI 84 year old female presents with shortness of breath.  She is a poor historian overall.  Sounds like she has been having symptoms for 3 or 4 days.  Shortness of breath seems to be pretty much constant but does seem worse with exertion.  No coughing, fever, sore throat.  No leg swelling.  Some on and off chest pain that seems to be mild and not currently present.  The chest pain or shortness of breath did not always come together.  Last night she had some chills and sweating. Currently while I'm talking to her she has no symptoms.    Past Medical History:  Diagnosis Date  . Angiodysplasia of intestine with hemorrhage   . Arthritis   . 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 Barrett esophagus   . Cataract    removed both eyes  . Esophageal reflux   . Gastric polyp 06/20/2006   HYPERPLASTIC POLYP  . GERD (gastroesophageal reflux disease)   . Hiatal hernia   . Osteoporosis   . Personal history of colonic polyps   . Stricture and stenosis of esophagus   . Unspecified hemorrhoids without mention of complication     Patient Active Problem List   Diagnosis Date Noted  . S/P total knee replacement using cement 07/15/2015  . Barrett's esophageal ulceration 05/09/2011  . Special screening for malignant neoplasms, colon 05/09/2011  . AVM (arteriovenous malformation) of colon 05/09/2011  . Multiple gastric polyps 05/09/2011  . Status post laparoscopic fundoplication [id number]  . GERD (gastroesophageal reflux disease) 10/10/2010  . ESOPHAGEAL STRICTURE 01/12/2008  . BARRETTS ESOPHAGUS 01/12/2008  . GASTRIC POLYP 10/23/2004  . 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 HIATAL HERNIA 10/23/2004  .  GASTROESOPHAGEAL REFLUX DISEASE, CHRONIC 10/12/2002  . HEMORRHOIDS 06/23/2001  . ANGIODYSPLASIA OF INTESTINE WITH HEMORRHAGE 06/23/2001    Past Surgical History:  Procedure Laterality Date  . CARPAL TUNNEL RELEASE Left    with cyst removal  . CATARACT EXTRACTION Bilateral 2010  . COLONOSCOPY    . HIATAL HERNIA REPAIR    . NISSEN FUNDOPLICATION  [id number]  . POLYPECTOMY    . TOE AMPUTATION Right 2006  . TOTAL KNEE ARTHROPLASTY Left 07/15/2015   Procedure: LEFT TOTAL KNEE ARTHROPLASTY;  Surgeon: Netta Cedars, MD;  Location: [facility];  Service: Orthopedics;  Laterality: Left;  . UPPER GASTROINTESTINAL ENDOSCOPY       OB History   No obstetric history on file.     Family History  Problem Relation Age of Onset  . Stomach cancer Mother   . Diabetes Sister   . Colon cancer Neg Hx   . Colon polyps Neg Hx   . Esophageal cancer Neg Hx   . Rectal cancer Neg Hx     Social History   Tobacco Use  . Smoking status: Never Smoker  . Smokeless tobacco: Never Used  Substance Use Topics  . Alcohol use: No  . Drug use: No    Home Medications Prior to Admission medications   Medication Sig Start Date End Date Taking? Authorizing Provider  alendronate (FOSAMAX) 70 MG tablet Take 70 mg by mouth every 7 (seven) days. Take with a full glass of water on an  empty stomach.     [provider]  calcium-vitamin D (OSCAL WITH D) 500-200 MG-UNIT per tablet Take 1 tablet by mouth daily.      [provider]  furosemide (LASIX) 40 MG tablet Take 40 mg by mouth daily.      [provider]  HYDROcodone-acetaminophen (NORCO) 5-325 MG tablet Take 1-2 tablets by mouth every 6 (six) hours as needed for moderate pain. 07/15/15   Netta Cedars, MD  methocarbamol (ROBAXIN) 500 MG tablet Take 1 tablet (500 mg total) by mouth 3 (three) times daily as needed. 07/15/15   Netta Cedars, MD  pantoprazole (PROTONIX) 40 MG tablet Take 1 tablet (40 mg total) by mouth daily. 10/10/10   Sable Feil, MD  PRESCRIPTION MEDICATION Take 1 tablet by mouth at bedtime. B12 med. Pt states it is 20 mg    [provider]    Allergies    Patient has no known allergies.  Review of Systems   Review of Systems  Constitutional: Positive for chills and diaphoresis. Negative for fever.  HENT: Positive for sore throat.   Respiratory: Positive for shortness of breath. Negative for cough.   Cardiovascular: Positive for chest pain. Negative for leg swelling.  All other systems reviewed and are negative.   Physical Exam Updated Vital Signs BP 133/64   Pulse 89   Temp 97.8 F (36.6 C) (Oral)   Resp 20   Ht 5\' 2"  (1.575 m)   Wt 81.6 kg   SpO2 99%   BMI 32.92 kg/m   Physical Exam Vitals and nursing note reviewed.  Constitutional:      General: She is not in acute distress.    Appearance: She is well-developed. She is not ill-appearing or diaphoretic.  HENT:     Head: Normocephalic and atraumatic.     Right Ear: External ear normal.     Left Ear: External ear normal.     Nose: Nose normal.  Eyes:     General:        Right eye: No discharge.        Left eye: No discharge.  Cardiovascular:     Rate and Rhythm: Normal rate and regular rhythm.     Heart sounds: Normal heart sounds.  Pulmonary:     Effort: Pulmonary effort is normal. No tachypnea, accessory muscle usage or respiratory distress.     Breath sounds: Normal breath sounds. No decreased breath sounds, wheezing, rhonchi or rales.  Abdominal:     Palpations: Abdomen is soft.     Tenderness: There is no abdominal tenderness.  Musculoskeletal:     Right lower leg: No edema.     Left lower leg: No edema.  Skin:    General: Skin is warm and dry.  Neurological:     Mental Status: She is alert.  Psychiatric:        Mood and Affect: Mood is not anxious.     ED Results / Procedures / Treatments   Labs (all labs ordered are listed, but only abnormal results are displayed) Labs Reviewed  BASIC METABOLIC PANEL -  Abnormal; Notable for the following components:      Result Value   Potassium 3.4 (*)    Glucose, Bld 119 (*)    Calcium 8.5 (*)    All other components within normal limits  CBC WITH DIFFERENTIAL/PLATELET - Abnormal; Notable for the following components:   WBC 2.0 (*)    Platelets 62 (*)    Neutro  Abs 1.1 (*)    Lymphs Abs 0.6 (*)    All other components within normal limits  D-DIMER, QUANTITATIVE (NOT AT [facility]) - Abnormal; Notable for the following components:   D-Dimer, Quant 2.42 (*)    All other components within normal limits  BRAIN NATRIURETIC PEPTIDE  POC SARS CORONAVIRUS 2 AG -  ED  TROPONIN I (HIGH SENSITIVITY)  TROPONIN I (HIGH SENSITIVITY)    EKG EKG Interpretation  Date/Time:  Thursday May 28 2019 10:29:34 EDT Ventricular Rate:  78 PR Interval:    QRS Duration: 82 QT Interval:  370 QTC Calculation: 422 R Axis:   44 Text Interpretation: Sinus rhythm Ventricular trigeminy Low voltage, precordial leads Borderline T wave abnormalities Confirmed by Sherwood Gambler [phone]) on 05/28/2019 10:30:48 AM   Radiology CT Angio Chest PE W and/or Wo Contrast  Result Date: 05/28/2019 CLINICAL DATA:  Shortness of breath EXAM: CT ANGIOGRAPHY CHEST WITH CONTRAST TECHNIQUE: Multidetector CT imaging of the chest was performed using the standard protocol during bolus administration of intravenous contrast. Multiplanar CT image reconstructions and MIPs were obtained to evaluate the vascular anatomy. CONTRAST:  24mL OMNIPAQUE IOHEXOL 350 MG/ML SOLN COMPARISON:  Chest radiograph May 28, 2019 FINDINGS: Cardiovascular: There is no demonstrable pulmonary embolus. There is no thoracic aortic aneurysm. No dissection is evident. The bolus of contrast in the aorta is somewhat less than optimal for dissection assessment. The visualized great vessels show scattered foci of calcification. There are foci of aortic atherosclerosis. There are several coronary artery calcifications noted. No pericardial  effusion or pericardial thickening. The main pulmonary outflow tract is prominent, measuring 3.2 cm in diameter. Mediastinum/Nodes: Visualized thyroid appears normal. There are scattered subcentimeter mediastinal lymph nodes which do not meet size criteria for pathologic significance. There is a focal hiatal hernia. Lungs/Pleura: There is mild bibasilar atelectatic change. There is no evident edema or airspace opacity. No pleural effusions are evident. Upper Abdomen: There is upper abdominal aortic atherosclerosis. There is extensive splenic artery calcification. Visualized upper abdominal structures otherwise appear unremarkable. Musculoskeletal: No blastic or lytic bone lesions. There are foci of degenerative change in the thoracic spine. No evident chest wall lesions. Review of the MIP images confirms the above findings. IMPRESSION: 1. No demonstrable pulmonary embolus. No thoracic aortic aneurysm. No dissection evident with note that the contrast bolus in the aorta is less than optimal for potential dissection assessment. There is aortic atherosclerosis as well as occasional foci of great vessel and coronary artery calcification. 2. Prominence of the main pulmonary outflow tract, a finding indicative of a degree of pulmonary arterial hypertension. 3. Areas of mild atelectatic change. No lung edema or airspace opacity. 4.  Focal hiatal hernia. 5.  No evident adenopathy. Aortic Atherosclerosis (ICD10-I70.0). Electronically Signed   By: Lowella Grip III M.D.   On: 05/28/2019 13:19   DG Chest Portable 1 View  Result Date: 05/28/2019 CLINICAL DATA:  Chest pain.  Shortness of breath. EXAM: PORTABLE CHEST 1 VIEW COMPARISON:  06/28/2004. FINDINGS: Mediastinum and hilar structures normal. Lungs are clear. No pleural effusion or pneumothorax. Heart size normal. Degenerative change thoracic spine. IMPRESSION: No acute cardiopulmonary disease. Electronically Signed   By: Marcello Moores  Register   On: 05/28/2019 10:56     Procedures Procedures (including critical care time)  Medications Ordered in ED Medications  aspirin chewable tablet 324 mg (has no administration in time range)    ED Course  I have reviewed the triage vital signs and the nursing notes.  Pertinent labs & imaging  results that were available during my care of the patient were reviewed by me and considered in my medical decision making (see chart for details).    MDM Rules/Calculators/A&P                      Patient is feeling much better.  She did have a little bit of shortness of breath when walking but now feels fine.  There is no chest pain currently.  Troponins are negative x2.  Work-up for PE is negative.  We'll replete her mildly low potassium.  She otherwise feels fine and we discussed observation versus discharge.  She would like to go home and follow-up closely with PCP which I think is pretty reasonable.  No infectious type symptoms.  We discussed return precautions. Final Clinical Impression(s) / ED Diagnoses Final diagnoses:  Exertional shortness of breath    Rx / DC Orders ED Discharge Orders    None       Sherwood Gambler, MD 05/28/19 1514

## 2019-05-28 NOTE — ED Notes (Signed)
Patient verbalizes understanding of discharge instructions. Opportunity for questioning and answers were provided. Armband removed by staff, pt discharged from ED ambulatory.   

## 2019-11-25 ENCOUNTER — Encounter: Payer: Self-pay | Admitting: Gastroenterology

## 2019-12-02 ENCOUNTER — Encounter: Payer: Self-pay | Admitting: Gastroenterology

## 2020-01-26 ENCOUNTER — Ambulatory Visit (INDEPENDENT_AMBULATORY_CARE_PROVIDER_SITE_OTHER): Payer: Medicare Other | Admitting: Gastroenterology

## 2020-01-26 ENCOUNTER — Encounter: Payer: Self-pay | Admitting: Gastroenterology

## 2020-01-26 VITALS — BP 120/60 | HR 82 | Ht 61.75 in | Wt 176.0 lb

## 2020-01-26 DIAGNOSIS — R1319 Other dysphagia: Secondary | ICD-10-CM | POA: Diagnosis not present

## 2020-01-26 DIAGNOSIS — K2270 Barrett's esophagus without dysplasia: Secondary | ICD-10-CM

## 2020-01-26 NOTE — Progress Notes (Addendum)
[facility] Progress Note  Chief Complaint: Barrett's esophagus  Subjective  History: Dysplastic long segment Barrett's esophagus since 2010, repeat upper endoscopies 2013 in September 2017, biopsy still nondysplastic.  Upper endoscopy September 2018 for dysphagia, stable endoscopic findings including hiatal hernia and evidence of prior fundoplication as well as Barrett's without suspicious appearance.  No additional biopsies taken that exam. Patient came up for recall and brought for office visit due to age.  Samantha Ayala is feeling well overall.  She is active in her church and neighborhood, helping neighbors rake leaves and she makes blankets and quilts as well.  She has occasional dysphagia if she swallows something too large or too quickly.  This has been stable over years.  She takes care to chew carefully and eat slowly and something to drink after every couple of bites.  She denies nausea vomiting or abdominal pain.  Bowel habits have been regular without rectal bleeding.  ROS: Cardiovascular:  no chest pain Respiratory: no dyspnea Arthralgias Remainder of systems negative except as above The patient's Past Medical, Family and Social History were reviewed and are on file in the EMR. Past Medical History:  Diagnosis Date  . Angiodysplasia of intestine with hemorrhage   . Arthritis   . 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 Barrett esophagus   . Cataract    removed both eyes  . Esophageal reflux   . Gastric polyp 06/20/2006   HYPERPLASTIC POLYP  . GERD (gastroesophageal reflux disease)   . Hiatal hernia   . Osteoporosis   . Personal history of colonic polyps   . Stricture and stenosis of esophagus   . Unspecified hemorrhoids without mention of complication    Past Surgical History:  Procedure Laterality Date  . CARPAL TUNNEL RELEASE Left    with cyst removal  . CATARACT EXTRACTION Bilateral 2010  . COLONOSCOPY    . NISSEN FUNDOPLICATION  8657   . POLYPECTOMY    . SKIN CANCER EXCISION     x 2  . TOE AMPUTATION Right 2006  . TOTAL KNEE ARTHROPLASTY Left 07/15/2015   Procedure: LEFT TOTAL KNEE ARTHROPLASTY;  Surgeon: Netta Cedars, MD;  Location: [facility];  Service: Orthopedics;  Laterality: Left;  . UPPER GASTROINTESTINAL ENDOSCOPY     Her mother died from stomach cancer  Objective:  Med list reviewed  Current Outpatient Medications:  .  alendronate (FOSAMAX) 35 MG tablet, Take 35 mg by mouth once a week. On Friday, Disp: , Rfl:  .  calcium-vitamin D (OSCAL WITH D) 500-200 MG-UNIT per tablet, Take 1 tablet by mouth daily.  , Disp: , Rfl:  .  cholecalciferol (VITAMIN D3) 25 MCG (1000 UNIT) tablet, Take 1,000 Units by mouth daily., Disp: , Rfl:  .  furosemide (LASIX) 40 MG tablet, Take 40 mg by mouth daily.  , Disp: , Rfl:  .  HYDROcodone-acetaminophen (NORCO) 5-325 MG tablet, Take 1-2 tablets by mouth every 6 (six) hours as needed for moderate pain., Disp: 60 tablet, Rfl: 0 .  lovastatin (MEVACOR) 20 MG tablet, Take 20 mg by mouth daily., Disp: , Rfl:  .  methocarbamol (ROBAXIN) 500 MG tablet, Take 1 tablet (500 mg total) by mouth 3 (three) times daily as needed., Disp: 60 tablet, Rfl: 1 .  pantoprazole (PROTONIX) 40 MG tablet, Take 1 tablet (40 mg total) by mouth daily., Disp: 30 tablet, Rfl: 11 .  Propylene Glycol (SYSTANE BALANCE) 0.6 % SOLN, Place 1 drop into both eyes as  needed (dry eyes)., Disp: , Rfl:    Vital signs in last 24 hrs: Vitals:   01/26/20 1423  BP: 120/60  Pulse: 82  SpO2: 96%    Physical Exam  Well-appearing, normal vocal quality  HEENT: sclera anicteric, oral mucosa moist without lesions  Neck: supple, no thyromegaly, JVD or lymphadenopathy  Cardiac: RRR without murmurs, S1S2 heard, no peripheral edema  Pulm: clear to auscultation bilaterally, normal RR and effort noted  Abdomen: soft, no tenderness, with active bowel sounds. No guarding or palpable hepatosplenomegaly.  Skin; warm and dry, no  jaundice or rash  Labs:   ___________________________________________ Radiologic studies:   ____________________________________________ Other:   _____________________________________________ Assessment & Plan  Assessment: Encounter Diagnoses  Name Primary?  . Barrett's esophagus without dysplasia Yes  . Esophageal dysphagia    Intermittent dysphagia from prior fundoplication and presbyesophagus.  Nondysplastic long segment Barrett's.  Current guidelines without clear age at which to stop surveillance.  She will be 85 soon, but given her very good health overall, I recommended an upper endoscopy with Barrett's in the next couple of months.   If no dysplasia on this exam, I think this can be her last surveillance upper endoscopy.  Plan: Upper endoscopy with surveillance biopsies.  She was agreeable after discussion of procedure and risks.  The benefits and risks of the planned procedure were described in detail with the patient or (when appropriate) their health care proxy.  Risks were outlined as including, but not limited to, bleeding, infection, perforation, adverse medication reaction leading to cardiac or pulmonary decompensation, pancreatitis (if ERCP).  The limitation of incomplete mucosal visualization was also discussed.  No guarantees or warranties were given.  Nelida Meuse III

## 2020-01-26 NOTE — Patient Instructions (Signed)
If you are age 82 or older, your body mass index should be between 23-30. Your Body mass index is 32.45 kg/m. If this is out of the aforementioned range listed, please consider follow up with your Primary Care Provider.  If you are age 48 or younger, your body mass index should be between 19-25. Your Body mass index is 32.45 kg/m. If this is out of the aformentioned range listed, please consider follow up with your Primary Care Provider.   You have been scheduled for an endoscopy. Please follow written instructions given to you at your visit today. If you use inhalers (even only as needed), please bring them with you on the day of your procedure.  Follow up pending at this time.  It was a pleasure to see you today!  Dr. Loletha Carrow

## 2020-03-23 ENCOUNTER — Ambulatory Visit (AMBULATORY_SURGERY_CENTER): Payer: Medicare Other | Admitting: Gastroenterology

## 2020-03-23 ENCOUNTER — Other Ambulatory Visit: Payer: Self-pay

## 2020-03-23 ENCOUNTER — Encounter: Payer: Self-pay | Admitting: Gastroenterology

## 2020-03-23 VITALS — BP 116/51 | HR 61 | Temp 98.4°F | Resp 24 | Ht 61.0 in | Wt 176.0 lb

## 2020-03-23 DIAGNOSIS — K22719 Barrett's esophagus with dysplasia, unspecified: Secondary | ICD-10-CM

## 2020-03-23 DIAGNOSIS — K225 Diverticulum of esophagus, acquired: Secondary | ICD-10-CM | POA: Diagnosis not present

## 2020-03-23 DIAGNOSIS — R1310 Dysphagia, unspecified: Secondary | ICD-10-CM

## 2020-03-23 DIAGNOSIS — K317 Polyp of stomach and duodenum: Secondary | ICD-10-CM

## 2020-03-23 DIAGNOSIS — K449 Diaphragmatic hernia without obstruction or gangrene: Secondary | ICD-10-CM | POA: Diagnosis not present

## 2020-03-23 DIAGNOSIS — K2270 Barrett's esophagus without dysplasia: Secondary | ICD-10-CM | POA: Diagnosis not present

## 2020-03-23 MED ORDER — SODIUM CHLORIDE 0.9 % IV SOLN: 500.0000 mL | Freq: Once | INTRAVENOUS | Status: AC

## 2020-03-23 NOTE — Progress Notes (Signed)
1452 Robinul 0.1 mg IV given due large amount of secretions upon assessment.  MD made aware, vss 

## 2020-03-23 NOTE — Progress Notes (Signed)
Report given to PACU, vss 

## 2020-03-23 NOTE — Patient Instructions (Signed)
YOU HAD AN ENDOSCOPIC PROCEDURE TODAY AT THE [facility]:   Refer to the procedure report that was given to you for any specific questions about what was found during the examination.  If the procedure report does not answer your questions, please call your gastroenterologist to clarify.  If you requested that your care partner not be given the details of your procedure findings, then the procedure report has been included in a sealed envelope for you to review at your convenience later.  YOU SHOULD EXPECT: Some feelings of bloating in the abdomen. Passage of more gas than usual.  Walking can help get rid of the air that was put into your GI tract during the procedure and reduce the bloating. If you had a lower endoscopy (such as a colonoscopy or flexible sigmoidoscopy) you may notice spotting of blood in your stool or on the toilet paper. If you underwent a bowel prep for your procedure, you may not have a normal bowel movement for a few days.  Please Note:  You might notice some irritation and congestion in your nose or some drainage.  This is from the oxygen used during your procedure.  There is no need for concern and it should clear up in a day or so.  SYMPTOMS TO REPORT IMMEDIATELY:     Following upper endoscopy (EGD)  Vomiting of blood or coffee ground material  New chest pain or pain under the shoulder blades  Painful or persistently difficult swallowing  New shortness of breath  Fever of 100F or higher  Black, tarry-looking stools  For urgent or emergent issues, a gastroenterologist can be reached at any hour by calling [phone]. Do not use MyChart messaging for urgent concerns.    DIET:  We do recommend a small meal at first, but then you may proceed to your regular diet.  Drink plenty of fluids but you should avoid alcoholic beverages for 24 hours.  MEDICATIONS: Continue present medications.  Please see handouts given to you by your recovery nurse. ACTIVITY:   You should plan to take it easy for the rest of today and you should NOT DRIVE or use heavy machinery until tomorrow (because of the sedation medicines used during the test).    FOLLOW UP: Our staff will call the number listed on your records 48-72 hours following your procedure to check on you and address any questions or concerns that you may have regarding the information given to you following your procedure. If we do not reach you, we will leave a message.  We will attempt to reach you two times.  During this call, we will ask if you have developed any symptoms of COVID 19. If you develop any symptoms (ie: fever, flu-like symptoms, shortness of breath, cough etc.) before then, please call [phone].  If you test positive for Covid 19 in the 2 weeks post procedure, please call and report this information to us.    If any biopsies were taken you will be contacted by phone or by letter within the next 1-3 weeks.  Please call us at [phone] if you have not heard about the biopsies in 3 weeks.   Thank you for allowing us to provide for your healthcare needs today.   SIGNATURES/CONFIDENTIALITY: You and/or your care partner have signed paperwork which will be entered into your electronic medical record.  These signatures attest to the fact that that the information above on your After Visit Summary has been reviewed and is understood.    Full responsibility of the confidentiality of this discharge information lies with you and/or your care-partner.

## 2020-03-23 NOTE — Progress Notes (Signed)
Pt's states no medical or surgical changes since previsit or office visit. 

## 2020-03-23 NOTE — Progress Notes (Signed)
Called to room to assist during endoscopic procedure.  Patient ID and intended procedure confirmed with present staff. Received instructions for my participation in the procedure from the performing physician.  

## 2020-03-23 NOTE — Op Note (Signed)
[facility] Patient Name: Samantha Ayala Procedure Date: 03/23/2020 2:47 PM MRN: [id number] Endoscopist: Mallie Mussel L. Loletha Carrow , MD Age: 85 Referring MD:  Date of Birth: December 27, 1935 Gender: Female Account #: [id number] Procedure:                Upper GI endoscopy Indications:              Surveillance for malignancy due to personal history                            of Barrett's esophagus (non-dysplastic long-segment                            Barrett's since 2010) - last esophageal biopsies in                            2017 Medicines:                Monitored Anesthesia Care Procedure:                Pre-Anesthesia Assessment:                           - Prior to the procedure, a History and Physical                            was performed, and patient medications and                            allergies were reviewed. The patient's tolerance of                            previous anesthesia was also reviewed. The risks                            and benefits of the procedure and the sedation                            options and risks were discussed with the patient.                            All questions were answered, and informed consent                            was obtained. Prior Anticoagulants: The patient has                            taken no previous anticoagulant or antiplatelet                            agents. ASA Grade Assessment: II - A patient with                            mild systemic disease. After reviewing the risks  and benefits, the patient was deemed in                            satisfactory condition to undergo the procedure.                           After obtaining informed consent, the endoscope was                            passed under direct vision. Throughout the                            procedure, the patient's blood pressure, pulse, and                            oxygen saturations were monitored  continuously. The                            Endoscope was introduced through the mouth, and                            advanced to the second part of duodenum. The upper                            GI endoscopy was accomplished without difficulty.                            The patient tolerated the procedure well. Scope In: Scope Out: Findings:                 There were esophageal mucosal changes secondary to                            established long-segment Barrett's disease present                            in the middle third of the esophagus and in the                            lower third of the esophagus. (continuous, with a                            few small squamous islands at the proximal aspect)                            The maximum longitudinal extent of these mucosal                            changes was 6 cm in length. Mucosa was biopsied                            with a cold forceps for histology in 4 quadrants at  intervals of 2 cm from 25 to 31 cm from the                            incisors. A total of 4 specimen bottles were sent                            to pathology. The area was also examined under NBI.                            No raised or suspicious lesions were seen.                           A 4 cm hiatal hernia was present.                           Evidence of a fundoplication was found in the                            cardia. The wrap appeared intact. This was                            traversed.                           Multiple small sessile fundic gland polyps were                            found in the gastric body.                           A diverticulum was found in the second portion of                            the duodenum.                           The exam of the duodenum was otherwise normal. Complications:            No immediate complications. Estimated Blood Loss:     Estimated blood loss was  minimal. Impression:               - Esophageal mucosal changes secondary to                            established long-segment Barrett's disease.                            Biopsied.                           - 4 cm hiatal hernia.                           - A fundoplication was found. The wrap appears  intact.                           - Multiple fundic gland polyps.                           - Duodenal diverticulum. Recommendation:           - Patient has a contact number available for                            emergencies. The signs and symptoms of potential                            delayed complications were discussed with the                            patient. Return to normal activities tomorrow.                            Written discharge instructions were provided to the                            patient.                           - Resume previous diet.                           - Continue present medications.                           - Await pathology results.                           - No repeat upper endoscopy for surveillance of                            Barrett's esophagus due to age. Dallon Dacosta L. Loletha Carrow, MD 03/23/2020 3:15:36 PM This report has been signed electronically.

## 2020-03-25 ENCOUNTER — Telehealth: Payer: Self-pay

## 2020-03-25 NOTE — Telephone Encounter (Signed)
Follow up Call-  Call back number 03/23/2020  Post procedure Call Back phone  # [phone]  Permission to leave phone message Yes  Some recent data might be hidden    2nd follow up call made.  NALM

## 2020-03-31 ENCOUNTER — Encounter: Payer: Self-pay | Admitting: Gastroenterology

## 2021-06-25 ENCOUNTER — Emergency Department (HOSPITAL_COMMUNITY)
Admission: EM | Admit: 2021-06-25 | Discharge: 2021-06-26 | Disposition: A | Discharge status: Home / Self Care | Payer: Medicare Other | Attending: Emergency Medicine | Admitting: Emergency Medicine

## 2021-06-25 ENCOUNTER — Other Ambulatory Visit: Payer: Self-pay

## 2021-06-25 ENCOUNTER — Encounter (HOSPITAL_COMMUNITY): Payer: Self-pay

## 2021-06-25 ENCOUNTER — Emergency Department (HOSPITAL_COMMUNITY): Payer: Medicare Other

## 2021-06-25 DIAGNOSIS — R0789 Other chest pain: Secondary | ICD-10-CM | POA: Diagnosis not present

## 2021-06-25 DIAGNOSIS — M25512 Pain in left shoulder: Secondary | ICD-10-CM | POA: Insufficient documentation

## 2021-06-25 NOTE — ED Provider Notes (Signed)
?[facility] ?Provider Note ? ? ?CSN: [id number] ?Arrival date & time: 06/25/21  1830 ? ?  ? ?History ? ?Chief Complaint  ?Patient presents with  ? Shoulder Pain  ? ? ?Samantha Ayala is a 86 y.o. female presents with concern for 4 days of progressive worsening left shoulder/lateral chest wall pain that does radiate into her left lateral chest wall, but no where else.  No association with exertion or shortness of breath.  Nurse documented that she has been helping with neighbors yard work, however patient denies any new activities including yard work. Pain with range of motion of the shoulder the patient reports full range of motion of the shoulder.  States that she was not planning to come to the ED, was getting out of her PCP in the morning about her neighbors were worried and directed her to go to the hospital.  Presented via EMS. ? ?Patient does not have any alleviating or aggravating factors.  She has not taken any medication at home for this. ? ?Denies any chest pain, shortness of breath, palpitations, syncope.  She is not anticoagulated. Patient has a history of AVM of the colon, hiatal hernia, of the intestine, GERD, AVM.  ? ?HPI ? ?  ? ?Home Medications ?Prior to Admission medications   ?Medication Sig Start Date End Date Taking? Authorizing Provider  ?alendronate (FOSAMAX) 35 MG tablet Take 35 mg by mouth once a week. On Friday 03/03/19   [provider]  ?calcium-vitamin D (OSCAL WITH D) 500-200 MG-UNIT per tablet Take 1 tablet by mouth daily.    [provider]  ?cholecalciferol (VITAMIN D3) 25 MCG (1000 UNIT) tablet Take 1,000 Units by mouth daily.    [provider]  ?furosemide (LASIX) 40 MG tablet Take 40 mg by mouth daily.    [provider]  ?HYDROcodone-acetaminophen (NORCO) 5-325 MG tablet Take 1-2 tablets by mouth every 6 (six) hours as needed for moderate pain. 07/15/15   Netta Cedars, MD  ?lovastatin (MEVACOR) 20 MG tablet  Take 20 mg by mouth daily. 03/03/19   [provider]  ?methocarbamol (ROBAXIN) 500 MG tablet Take 1 tablet (500 mg total) by mouth 3 (three) times daily as needed. 07/15/15   Netta Cedars, MD  ?pantoprazole (PROTONIX) 40 MG tablet Take 1 tablet (40 mg total) by mouth daily. 10/10/10   Sable Feil, MD  ?Propylene Glycol (SYSTANE BALANCE) 0.6 % SOLN Place 1 drop into both eyes as needed (dry eyes).    [provider]  ?   ? ?Allergies    ?Patient has no known allergies.   ? ?Review of Systems   ?Review of Systems  ?Respiratory:  Negative for shortness of breath.   ?Cardiovascular:  Negative for chest pain and palpitations.  ?Musculoskeletal:   ?     Left shoudler pain  ? ?Physical Exam ?Updated Vital Signs ?BP 130/80 (BP Location: Right Arm)   Pulse 70   Temp 98.7 ?F (37.1 ?C) (Oral)   Resp 19   Ht '5\' 1"'$  (1.549 m)   Wt 79.8 kg   SpO2 99%   BMI 33.24 kg/m?  ?Physical Exam ?Vitals and nursing note reviewed.  ?Constitutional:   ?   Appearance: She is not ill-appearing or toxic-appearing.  ?HENT:  ?   Head: Normocephalic and atraumatic.  ?   Nose: Nose normal.  ?   Mouth/Throat:  ?   Mouth: Mucous membranes are moist.  ?   Pharynx: No oropharyngeal  exudate or posterior oropharyngeal erythema.  ?Eyes:  ?   General:     ?   Right eye: No discharge.     ?   Left eye: No discharge.  ?   Extraocular Movements: Extraocular movements intact.  ?   Conjunctiva/sclera: Conjunctivae normal.  ?   Pupils: Pupils are equal, round, and reactive to light.  ?Cardiovascular:  ?   Rate and Rhythm: Normal rate and regular rhythm.  ?   Pulses: Normal pulses.  ?   Heart sounds: Normal heart sounds. No murmur heard. ?Pulmonary:  ?   Effort: Pulmonary effort is normal. No respiratory distress.  ?   Breath sounds: Normal breath sounds. No wheezing or rales.  ?Abdominal:  ?   General: Bowel sounds are normal. There is no distension.  ?   Palpations: Abdomen is soft.  ?   Tenderness: There is no abdominal tenderness.  There is no guarding or rebound.  ?Musculoskeletal:     ?   General: No deformity.  ?     Arms: ? ?   Cervical back: Neck supple.  ?   Right lower leg: No edema.  ?   Left lower leg: No edema.  ?   Comments: Symmetric grip strength. Normal neurovascular status.   ?Lymphadenopathy:  ?   Cervical: No cervical adenopathy.  ?Skin: ?   General: Skin is warm and dry.  ?   Capillary Refill: Capillary refill takes less than 2 seconds.  ?Neurological:  ?   General: No focal deficit present.  ?   Mental Status: She is alert and oriented to person, place, and time. Mental status is at baseline.  ?Psychiatric:     ?   Mood and Affect: Mood normal.  ? ? ?ED Results / Procedures / Treatments   ?Labs ?(all labs ordered are listed, but only abnormal results are displayed) ?Labs Reviewed  ?BASIC METABOLIC PANEL - Abnormal; Notable for the following components:  ?    Result Value  ? Potassium 3.4 (*)   ? Glucose, Bld 101 (*)   ? Calcium 8.6 (*)   ? All other components within normal limits  ?CBC WITH DIFFERENTIAL/PLATELET  ?TROPONIN I (HIGH SENSITIVITY)  ?TROPONIN I (HIGH SENSITIVITY)  ? ? ?EKG ?None ? ?Radiology ?DG Chest 2 View ? ?Result Date: 06/25/2021 ?CLINICAL DATA:  Left chest and shoulder pain. EXAM: CHEST - 2 VIEW COMPARISON:  Chest x-ray 05/28/2019. FINDINGS: The heart size and mediastinal contours are within normal limits. Both lungs are clear. The visualized skeletal structures are unremarkable. IMPRESSION: No active cardiopulmonary disease. Electronically Signed   By: Ronney Asters M.D.   On: 06/25/2021 23:35  ? ?DG Shoulder Left ? ?Result Date: 06/25/2021 ?CLINICAL DATA:  Left shoulder pain x3 days. EXAM: LEFT SHOULDER - 2+ VIEW COMPARISON:  None. FINDINGS: There is no evidence of fracture or dislocation. A mild to moderate severity degenerative changes are seen involving the left acromioclavicular joint. Soft tissues are unremarkable. IMPRESSION: Degenerative changes involving the left acromioclavicular joint.  Electronically Signed   By: Virgina Norfolk M.D.   On: 06/25/2021 19:23   ? ?Procedures ?Procedures  ? ? ?Medications Ordered in ED ?Medications  ?lidocaine (LIDODERM) 5 % 1 patch (1 patch Transdermal Patch Applied 06/26/21 0051)  ? ? ?ED Course/ Medical Decision Making/ A&P ?Clinical Course as of 06/26/21 0141  ?Mon Jun 26, 2021  ?0047 EKG NSR without STEMI [RS]  ?0047 Patient informed this provider that she no longer wishes to stay in  the emergency department for completion of her labs.  She prefers to go over the results with her primary care doctor at her appointment tomorrow morning.  During this conversation she does state that she actually has been working in her yard helping her neighbors with their spring yard cleaning, and she feels this may have incited her shoulder pain.  She is well-appearing, denies CP or SOB, and continues to decline oral analgesia for shoulder pain. Is agreeable to lidoderm patch.  [RS]  ?  ?Clinical Course User Index ?[RS] Neala Miggins, Gypsy Balsam, PA-C  ? ?                        ?Medical Decision Making ?86 year old female presents with concern for left shoulder pain x4 days progressively worsening. ? ?Vital signs are normal and intake.  Cardiopulmonary and abdominal exams are benign.  Patient is neurovascular tact in all extremities.  No distress.  Oral analgesia offered, patient declines though she rates her pain a 7/10.  Full range of motion of the shoulder. ? ?Given vague description of pain that may radiate into the left chest and advanced age, as well as pain that was not initially reported as traumatic in nature or associated with any new activity, will proceed with basic labs for chest pain work-up. ? ?Amount and/or Complexity of Data Reviewed ?Labs: ordered. ?   Details: CBC unremarkable, troponin negative, BMP with mild hypokalemia 3.4 but otherwise unremarkable. ?Radiology: ordered. ?   Details: Plain film of the shoulder with degenerative changes. DG chest negative as  well.  Images visualized by this provider. ?ECG/medicine tests:  ?   Details: EKG NSR without STEMI. ? ?Risk ?Prescription drug management. ? ? ?Initially concern for possible ACS given patient's description of her pain r

## 2021-06-25 NOTE — ED Triage Notes (Addendum)
Pt arrived via [facility] from home for left shoulder painx3 days. Per EMS, pt has been helping neighbors in the yard the past week and think it's from that. Pt has full ROM of shoulder, but it's painful per EMS. Pt has 2+ right radial pulse, cap refill less than 3 sec, 5/5 right grip strength, warm to touch. Pt states has intermittent numbness and tingling. ?

## 2021-06-26 LAB — CBC WITH DIFFERENTIAL/PLATELET
Abs Immature Granulocytes: 0.02 10*3/uL (ref 0.00–0.07)
Basophils Absolute: 0 10*3/uL (ref 0.0–0.1)
Basophils Relative: 1 %
Eosinophils Absolute: 0.1 10*3/uL (ref 0.0–0.5)
Eosinophils Relative: 2 %
HCT: 39.5 % (ref 36.0–46.0)
Hemoglobin: 12.9 g/dL (ref 12.0–15.0)
Immature Granulocytes: 0 %
Lymphocytes Relative: 38 %
Lymphs Abs: 2.2 10*3/uL (ref 0.7–4.0)
MCH: 29.3 pg (ref 26.0–34.0)
MCHC: 32.7 g/dL (ref 30.0–36.0)
MCV: 89.6 fL (ref 80.0–100.0)
Monocytes Absolute: 0.4 10*3/uL (ref 0.1–1.0)
Monocytes Relative: 7 %
Neutro Abs: 3.1 10*3/uL (ref 1.7–7.7)
Neutrophils Relative %: 52 %
Platelets: 192 10*3/uL (ref 150–400)
RBC: 4.41 MIL/uL (ref 3.87–5.11)
RDW: 13 % (ref 11.5–15.5)
WBC: 5.9 10*3/uL (ref 4.0–10.5)
nRBC: 0 % (ref 0.0–0.2)

## 2021-06-26 LAB — BASIC METABOLIC PANEL
Anion gap: 8 (ref 5–15)
BUN: 11 mg/dL (ref 8–23)
CO2: 26 mmol/L (ref 22–32)
Calcium: 8.6 mg/dL — ABNORMAL LOW (ref 8.9–10.3)
Chloride: 108 mmol/L (ref 98–111)
Creatinine, Ser: 0.77 mg/dL (ref 0.44–1.00)
GFR, Estimated: >60 mL/min (ref >60)
Glucose, Bld: 101 mg/dL — ABNORMAL HIGH (ref 70–99)
Potassium: 3.4 mmol/L — ABNORMAL LOW (ref 3.5–5.1)
Sodium: 142 mmol/L (ref 135–145)

## 2021-06-26 LAB — TROPONIN I (HIGH SENSITIVITY): Troponin I (High Sensitivity): 4 ng/L (ref <18)

## 2021-06-26 MED ORDER — LIDOCAINE 5 % EX PTCH
1.0000 | MEDICATED_PATCH | CUTANEOUS | Status: DC
  Administered 2021-06-26: 1 via TRANSDERMAL
  Filled 2021-06-26: qty 1

## 2021-06-26 NOTE — Discharge Instructions (Signed)
You were seen in the ER today for your shoulder pain.  Given your description of extension of the pain into your left chest, blood work was obtained, however you chose to leave the emergency department prior to your labs resulting.  Your x-ray of your shoulder was negative and you have been given a Lidoderm patch to use for pain.  You may purchase these over the counter, and you may also use Tylenol at home.  It is likely muscular injury, however please follow-up closely with your primary care doctor to review the results of your blood work.  Return to the ER with any new severe symptoms. ?

## 2021-08-10 ENCOUNTER — Telehealth: Payer: Self-pay | Admitting: *Deleted

## 2021-08-10 ENCOUNTER — Ambulatory Visit (INDEPENDENT_AMBULATORY_CARE_PROVIDER_SITE_OTHER): Payer: Medicare Other | Admitting: Podiatry

## 2021-08-10 DIAGNOSIS — L603 Nail dystrophy: Secondary | ICD-10-CM | POA: Diagnosis not present

## 2021-08-10 NOTE — Patient Instructions (Signed)

## 2021-08-10 NOTE — Telephone Encounter (Signed)
Patient is concerned that her procedural toe is still bleeding a little,first noticed after leaving [facility] and that she did not receive her instructions after visit.   Encouraged her to elevate and ice,stay off her feet,verbalized understanding and will stop by to pick up instructions.

## 2021-08-11 NOTE — Progress Notes (Signed)
Subjective:  Patient ID: Samantha Ayala, female    DOB: 1936/01/11,  MRN: [id number]  Chief Complaint  Patient presents with   Nail Problem    Left hallux nail removal     86 y.o. female presents with the above complaint.  Patient presents with complaint of left hallux dystrophic nail.  Patient states got thicker and more painful as the years go by.  Hurts with ambulation and hurts with pressure.  She would like to have it removed and made permanent.  Pain scale 7 out of 10 especially with tight shoes.  She has not seen anyone else prior to seeing me.  She denies any other acute complaints.  She would like to have it removed   Review of Systems: Negative except as noted in the HPI. Denies N/V/F/Ch.  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   Barrett esophagus    Cataract    removed both eyes   Esophageal reflux    Gastric polyp 06/20/2006   HYPERPLASTIC POLYP   GERD (gastroesophageal reflux disease)    Hiatal hernia    Osteoporosis    Personal history of colonic polyps    Stricture and stenosis of esophagus    Unspecified hemorrhoids without mention of complication     Current Outpatient Medications:    alendronate (FOSAMAX) 35 MG tablet, Take 35 mg by mouth once a week. On Friday, Disp: , Rfl:    calcium-vitamin D (OSCAL WITH D) 500-200 MG-UNIT per tablet, Take 1 tablet by mouth daily., Disp: , Rfl:    cholecalciferol (VITAMIN D3) 25 MCG (1000 UNIT) tablet, Take 1,000 Units by mouth daily., Disp: , Rfl:    furosemide (LASIX) 40 MG tablet, Take 40 mg by mouth daily., Disp: , Rfl:    HYDROcodone-acetaminophen (NORCO) 5-325 MG tablet, Take 1-2 tablets by mouth every 6 (six) hours as needed for moderate pain., Disp: 60 tablet, Rfl: 0   lovastatin (MEVACOR) 20 MG tablet, Take 20 mg by mouth daily., Disp: , Rfl:    methocarbamol (ROBAXIN) 500 MG tablet, Take 1 tablet  (500 mg total) by mouth 3 (three) times daily as needed., Disp: 60 tablet, Rfl: 1   pantoprazole (PROTONIX) 40 MG tablet, Take 1 tablet (40 mg total) by mouth daily., Disp: 30 tablet, Rfl: 11   Propylene Glycol (SYSTANE BALANCE) 0.6 % SOLN, Place 1 drop into both eyes as needed (dry eyes)., Disp: , Rfl:   Current Facility-Administered Medications:    0.9 %  sodium chloride infusion, 500 mL, Intravenous, Once, Danis, Kirke Corin, MD  Social History   Tobacco Use  Smoking Status Never  Smokeless Tobacco Never    No Known Allergies Objective:  There were no vitals filed for this visit. There is no height or weight on file to calculate BMI. Constitutional Well developed. Well nourished.  Vascular Dorsalis pedis pulses palpable bilaterally. Posterior tibial pulses palpable bilaterally. Capillary refill normal to all digits.  No cyanosis or clubbing noted. Pedal hair growth normal.  Neurologic Normal speech. Oriented to person, place, and time. Epicritic sensation to light touch grossly present bilaterally.  Dermatologic Pain on palpation of the entire/total nail on 1st digit of the left No other open wounds. No skin lesions.  Orthopedic: Normal joint ROM without pain or crepitus bilaterally. No visible deformities. No bony tenderness.   Radiographs: None Assessment:   1. Nail  dystrophy    Plan:  Patient was evaluated and treated and all questions answered.  Nail contusion/dystrophy hallux, left -Patient elects to proceed with minor surgery to remove entire toenail today. Consent reviewed and signed by patient. -Entire/total nail excised. See procedure note. -Educated on post-procedure care including soaking. Written instructions provided and reviewed. -Patient to follow up in 2 weeks for nail check.  Procedure: Excision of entire/total nail with phenol matricectomy Location: Left 1st toe digit Anesthesia: Lidocaine 1% plain; 1.5 mL and Marcaine 0.5% plain; 1.5 mL,  digital block. Skin Prep: Betadine. Dressing: Silvadene; telfa; dry, sterile, compression dressing. Technique: Following skin prep, the toe was exsanguinated and a tourniquet was secured at the base of the toe. The affected nail border was freed and excised.  Phenol matricectomy was performed in standard technique the tourniquet was then removed and sterile dressing applied. Disposition: Patient tolerated procedure well. Patient to return in 2 weeks for follow-up.   No follow-ups on file.

## 2022-03-30 ENCOUNTER — Other Ambulatory Visit (INDEPENDENT_AMBULATORY_CARE_PROVIDER_SITE_OTHER): Payer: 59

## 2022-03-30 ENCOUNTER — Encounter: Payer: Self-pay | Admitting: Gastroenterology

## 2022-03-30 ENCOUNTER — Ambulatory Visit (INDEPENDENT_AMBULATORY_CARE_PROVIDER_SITE_OTHER): Payer: 59 | Admitting: Gastroenterology

## 2022-03-30 VITALS — BP 128/84 | HR 68 | Ht 61.0 in | Wt 167.1 lb

## 2022-03-30 DIAGNOSIS — K625 Hemorrhage of anus and rectum: Secondary | ICD-10-CM | POA: Diagnosis not present

## 2022-03-30 LAB — CBC WITH DIFFERENTIAL/PLATELET
Basophils Absolute: 0.1 10*3/uL (ref 0.0–0.1)
Basophils Relative: 0.9 % (ref 0.0–3.0)
Eosinophils Absolute: 0.1 10*3/uL (ref 0.0–0.7)
Eosinophils Relative: 1.2 % (ref 0.0–5.0)
HCT: 43.3 % (ref 36.0–46.0)
Hemoglobin: 14.5 g/dL (ref 12.0–15.0)
Lymphocytes Relative: 35.3 % (ref 12.0–46.0)
Lymphs Abs: 2.5 10*3/uL (ref 0.7–4.0)
MCHC: 33.6 g/dL (ref 30.0–36.0)
MCV: 87.1 fl (ref 78.0–100.0)
Monocytes Absolute: 0.5 10*3/uL (ref 0.1–1.0)
Monocytes Relative: 6.6 % (ref 3.0–12.0)
Neutro Abs: 3.9 10*3/uL (ref 1.4–7.7)
Neutrophils Relative %: 56 % (ref 43.0–77.0)
Platelets: 209 10*3/uL (ref 150.0–400.0)
RBC: 4.97 Mil/uL (ref 3.87–5.11)
RDW: 13.6 % (ref 11.5–15.5)
WBC: 7 10*3/uL (ref 4.0–10.5)

## 2022-03-30 NOTE — Progress Notes (Signed)
[facility] Consult Note:  History: Samantha Ayala 03/30/2022  Referring provider: Leonard Downing, MD  Reason for consult/chief complaint: Rectal Bleeding (Had a BM with blood in the water just 1 day about 3-4 weeks ago, none since, no other symptoms)   Subjective  HPI: Summary of pertinent GI issues: Intermittent dysphagia from prior fundoplication and presbyesophagus.   Nondysplastic long segment Barrett's since 2010 -last EGD January 2022, stable appearance, no dysplasia on biopsies.  No further recall recommended due to stability of the condition and the patient's age. ___________________  Samantha Ayala denies chronic abdominal pain or altered bowel habits.  Several weeks ago she had an episode of bright red blood per rectum without abdominal or rectal/anal pain.  No further episodes since then.  She feels her digestion is good, appetite normal, no chronic upper digestive symptoms.  She saw primary care shortly after it occurred, and she tells me no blood work or other testing was done at that time.  Last colonoscopy with Dr. Sharlett Iles in 2013 -complete exam, good prep.  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  No polyps.  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   Barrett esophagus    Cataract    removed both eyes   Esophageal reflux    Gastric polyp 06/20/2006   HYPERPLASTIC POLYP   GERD (gastroesophageal reflux disease)    Hiatal hernia    Osteoporosis    Personal history of colonic polyps    Stricture and stenosis of esophagus    Unspecified hemorrhoids without mention of complication      Past Surgical History: Past Surgical History:  Procedure Laterality Date   CARPAL TUNNEL RELEASE Left    with cyst removal   CATARACT EXTRACTION Bilateral 2010    COLONOSCOPY     NISSEN FUNDOPLICATION  2707   POLYPECTOMY     SKIN CANCER EXCISION     x 2   TOE AMPUTATION Right 2006   TOTAL KNEE ARTHROPLASTY Left 07/15/2015   Procedure: LEFT TOTAL KNEE ARTHROPLASTY;  Surgeon: Netta Cedars, MD;  Location: [facility];  Service: Orthopedics;  Laterality: Left;   UPPER GASTROINTESTINAL ENDOSCOPY       Family History: Family History  Problem Relation Age of Onset   Stomach cancer Mother    Diabetes Sister    Colon cancer Neg Hx    Colon polyps Neg Hx    Esophageal cancer Neg Hx    Rectal cancer Neg Hx     Social History: Social History   Socioeconomic History   Marital status: Widowed    Spouse name: Not on file   Number of children: 2   Years of education: Not on file   Highest education level: Not on file  Occupational History   Occupation: Retired  Tobacco Use   Smoking status: Never   Smokeless tobacco: Never  Vaping Use   Vaping Use: Never used  Substance and Sexual Activity   Alcohol use: No   Drug use: No   Sexual activity: Not on file  Other Topics Concern   Not on file  Social History Narrative   Not on file   Social Determinants of Health   Financial Resource Strain: Not on file  Food Insecurity: Not on file  Transportation Needs: Not on file  Physical Activity: Not on file  Stress: Not on file  Social Connections: Not on file    Allergies: No Known Allergies  Outpatient Meds: Current Outpatient Medications  Medication Sig Dispense Refill   Cyanocobalamin (VITAMIN B-12) 2500 MCG SUBL Place 1 tablet under the tongue daily.     furosemide (LASIX) 40 MG tablet Take 40 mg by mouth daily.     lovastatin (MEVACOR) 20 MG tablet Take 20 mg by mouth daily.     pantoprazole (PROTONIX) 40 MG tablet Take 1 tablet (40 mg total) by mouth daily. 30 tablet 11   potassium chloride SA (KLOR-CON M) 20 MEQ tablet Take 20 mEq by mouth once.     Propylene Glycol (SYSTANE BALANCE) 0.6 % SOLN Place 1 drop into both eyes as needed (dry  eyes).     alendronate (FOSAMAX) 35 MG tablet Take 35 mg by mouth once a week. On Friday (Patient not taking: Reported on 03/30/2022)     Current Facility-Administered Medications  Medication Dose Route Frequency Provider Last Rate Last Admin   0.9 %  sodium chloride infusion  500 mL Intravenous Once Doran Stabler, MD          ___________________________________________________________________ Objective   Exam:  BP 128/84 (BP Location: Left Arm, Patient Position: Sitting, Cuff Size: Normal)   Pulse 68   Ht '5\' 1"'$  (1.549 m)   Wt 167 lb 2 oz (75.8 kg)   BMI 31.58 kg/m  Wt Readings from Last 3 Encounters:  03/30/22 167 lb 2 oz (75.8 kg)  06/25/21 175 lb 14.8 oz (79.8 kg)  03/23/20 176 lb (79.8 kg)    General: Well-appearing CV: Regular without appreciable murmur, no JVD, no peripheral edema Resp: clear to auscultation bilaterally, normal RR and effort noted GI: soft, no tenderness, with active bowel sounds. No guarding or palpable organomegaly noted. Skin; warm and dry, no rash or jaundice noted Normal perianal exam.  DRE normal without fissure, tenderness or palpable internal lesion. Anoscopy: Internal hemorrhoids   No data for review   Assessment: No diagnosis found. Bleeding internal hemorrhoids  I believe that explains her recent episode.  No chronic digestive issues, would not recommend colonoscopy at this point. Check CBC today   Plan:  In addition to above, patient was encouraged to contact me if recurrent episodes of bleeding.  Thank you for the courtesy of this consult.  Please call me with any questions or concerns.  Nelida Meuse III  CC: Referring provider noted above

## 2022-03-30 NOTE — Patient Instructions (Signed)
_______________________________________________________  If your blood pressure at your visit was 140/90 or greater, please contact your primary care physician to follow up on this.  _______________________________________________________  If you are age 85 or older, your body mass index should be between 23-30. Your Body mass index is 31.58 kg/m. If this is out of the aforementioned range listed, please consider follow up with your Primary Care Provider.  If you are age 42 or younger, your body mass index should be between 19-25. Your Body mass index is 31.58 kg/m. If this is out of the aformentioned range listed, please consider follow up with your Primary Care Provider.   ________________________________________________________  The [facility] providers would like to encourage you to use [facility] to communicate with providers for non-urgent requests or questions.  Due to long hold times on the telephone, sending your provider a message by [facility] may be a faster and more efficient way to get a response.  Please allow 48 business hours for a response.  Please remember that this is for non-urgent requests.  _______________________________________________________  Your provider has requested that you go to the basement level for lab work before leaving today. Press "B" on the elevator. The lab is located at the first door on the left as you exit the elevator.  Due to recent changes in healthcare laws, you may see the results of your imaging and laboratory studies on MyChart before your provider has had a chance to review them.  We understand that in some cases there may be results that are confusing or concerning to you. Not all laboratory results come back in the same time frame and the provider may be waiting for multiple results in order to interpret others.  Please give [place] 48 hours in order for your provider to thoroughly review all the results before contacting the office for clarification of  your results.    It was a pleasure to see you today!  Thank you for trusting me with your gastrointestinal care!

## 2023-05-06 ENCOUNTER — Encounter: Payer: Self-pay | Admitting: Physician Assistant

## 2023-05-06 ENCOUNTER — Ambulatory Visit (INDEPENDENT_AMBULATORY_CARE_PROVIDER_SITE_OTHER): Payer: 59 | Admitting: Physician Assistant

## 2023-05-06 VITALS — BP 118/62 | HR 71 | Ht 62.0 in | Wt 174.0 lb

## 2023-05-06 DIAGNOSIS — K648 Other hemorrhoids: Secondary | ICD-10-CM

## 2023-05-06 DIAGNOSIS — K625 Hemorrhage of anus and rectum: Secondary | ICD-10-CM | POA: Diagnosis not present

## 2023-05-06 DIAGNOSIS — K641 Second degree hemorrhoids: Secondary | ICD-10-CM

## 2023-05-06 MED ORDER — HYDROCORTISONE ACETATE 25 MG RE SUPP: 25.0000 mg | Freq: Two times a day (BID) | RECTAL | 1 refills | Status: AC

## 2023-05-06 NOTE — Progress Notes (Signed)
 Chief Complaint: Rectal bleeding  HPI:    [name] is an 88 year old female with a past medical history as listed below including Barrett's esophagus and prior fundoplication with intermittent dysphagia and internal hemorrhoids, who presents to clinic today for complaint of rectal bleeding.    2013 colonoscopy with Dr. Erven Colla exam, good prep.  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    January 2022 EGD with nondysplastic long segment Barrett's-stable appearance, no dysplasia.  No further recall recommended due to age.    03/30/2022 patient seen in clinic by Dr. Myrtie Neither for rectal bleeding.  At that time DRE normal without fissure tenderness or palpable internal lesion.  Internal hemorrhoids on anoscopy.  At that point told to contact us for recurrent bleeding issues.    03/30/2022 CBC with a hemoglobin normal at 14.5.    Today, the patient tells me that she does not recall being in here previously for rectal bleeding.  She had 1 episode of bright red blood on the toilet paper and in her stool about a month ago and has not seen any since.  No rectal pain.  Apparently called her primary care doctor who told her to follow-up with [place].  Tells me she has regular bowel movements no abdominal pain, overall no weight loss, she feels well but just wanted to get this checked out because "I like to take care of myself".    Denies fever, chills or weight loss.  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   Barrett esophagus    Cataract    removed both eyes   Esophageal reflux    Gastric polyp 06/20/2006   HYPERPLASTIC POLYP   GERD (gastroesophageal reflux disease)    Hiatal hernia    Osteoporosis    Personal history of colonic polyps    Stricture and stenosis of esophagus    Unspecified hemorrhoids without mention of complication     Past Surgical History:  Procedure  Laterality Date   CARPAL TUNNEL RELEASE Left    with cyst removal   CATARACT EXTRACTION Bilateral 2010   COLONOSCOPY     NISSEN FUNDOPLICATION  1983   POLYPECTOMY     SKIN CANCER EXCISION     x 2   TOE AMPUTATION Right 2006   TOTAL KNEE ARTHROPLASTY Left 07/15/2015   Procedure: LEFT TOTAL KNEE ARTHROPLASTY;  Surgeon: Beverely Low, MD;  Location: [facility];  Service: Orthopedics;  Laterality: Left;   UPPER GASTROINTESTINAL ENDOSCOPY      Current Outpatient Medications  Medication Sig Dispense Refill   alendronate (FOSAMAX) 35 MG tablet Take 35 mg by mouth once a week. On Friday     Cyanocobalamin (VITAMIN B-12) 2500 MCG SUBL Place 1 tablet under the tongue daily.     furosemide (LASIX) 40 MG tablet Take 40 mg by mouth daily.     lovastatin (MEVACOR) 20 MG tablet Take 20 mg by mouth daily.     pantoprazole (PROTONIX) 40 MG tablet Take 1 tablet (40 mg total) by mouth daily. 30 tablet 11   potassium chloride SA (KLOR-CON M) 20 MEQ tablet Take 20 mEq by mouth once.     Propylene Glycol (SYSTANE BALANCE) 0.6 % SOLN Place 1 drop into both eyes as needed (dry eyes).     Current Facility-Administered Medications  Medication Dose Route Frequency Provider Last Rate Last Admin  0.9 %  sodium chloride infusion  500 mL Intravenous Once Sherrilyn Rist, MD        Allergies as of 05/06/2023   (No Known Allergies)    Family History  Problem Relation Age of Onset   Stomach cancer Mother    Diabetes Sister    Colon cancer Neg Hx    Colon polyps Neg Hx    Esophageal cancer Neg Hx    Rectal cancer Neg Hx     Social History   Socioeconomic History   Marital status: Widowed    Spouse name: Not on file   Number of children: 2   Years of education: Not on file   Highest education level: Not on file  Occupational History   Occupation: Retired  Tobacco Use   Smoking status: Never   Smokeless tobacco: Never  Vaping Use   Vaping status: Never Used  Substance and Sexual Activity    Alcohol use: No   Drug use: No   Sexual activity: Not on file  Other Topics Concern   Not on file  Social History Narrative   Not on file   Social Drivers of Health   Financial Resource Strain: Not on file  Food Insecurity: Not on file  Transportation Needs: Not on file  Physical Activity: Not on file  Stress: Not on file  Social Connections: Not on file  Intimate Partner Violence: Not on file    Review of Systems:    Constitutional: No weight loss, fever or chills Cardiovascular: No chest pain  Respiratory: No SOB Gastrointestinal: See HPI and otherwise negative   Physical Exam:  Vital signs: BP 118/62   Pulse 71   Ht 5\' 2"  (1.575 m)   Wt 174 lb (78.9 kg)   BMI 31.83 kg/m    Constitutional:   Pleasant Elderly Caucasian female appears to be in NAD, Well developed, Well nourished, alert and cooperative Respiratory: Respirations even and unlabored. Lungs clear to auscultation bilaterally.   No wheezes, crackles, or rhonchi.  Cardiovascular: Normal S1, S2. No MRG. Regular rate and rhythm. No peripheral edema, cyanosis or pallor.  Gastrointestinal:  Soft, nondistended, nontender. No rebound or guarding. Normal bowel sounds. No appreciable masses or hepatomegaly. Rectal: External: No mass no lesion, visible internal hemorrhoids mildly prolapsed with decreased internal sphincter tone; internal: No mass, no fissure; Anoscopy: Grade 2 internal hemorrhoids with some stigmata of recent bleeding Psychiatric: Demonstrates good judgement and reason without abnormal affect or behaviors.  RELEVANT LABS AND IMAGING: CBC    Component Value Date/Time   WBC 7.0 03/30/2022 1437   RBC 4.97 03/30/2022 1437   HGB 14.5 03/30/2022 1437   HCT 43.3 03/30/2022 1437   PLT 209.0 03/30/2022 1437   MCV 87.1 03/30/2022 1437   MCH 29.3 06/26/2021 0034   MCHC 33.6 03/30/2022 1437   RDW 13.6 03/30/2022 1437   LYMPHSABS 2.5 03/30/2022 1437   MONOABS 0.5 03/30/2022 1437   EOSABS 0.1 03/30/2022 1437    BASOSABS 0.1 03/30/2022 1437    CMP     Component Value Date/Time   NA 142 06/26/2021 0034   K 3.4 (L) 06/26/2021 0034   CL 108 06/26/2021 0034   CO2 26 06/26/2021 0034   GLUCOSE 101 (H) 06/26/2021 0034   BUN 11 06/26/2021 0034   CREATININE 0.77 06/26/2021 0034   CALCIUM 8.6 (L) 06/26/2021 0034   PROT 6.9 10/10/2010 1122   ALBUMIN 4.3 10/10/2010 1122   AST 22 10/10/2010 1122   ALT 21  10/10/2010 1122   ALKPHOS 73 10/10/2010 1122   BILITOT 0.6 10/10/2010 1122   GFRNONAA >60 06/26/2021 0034   GFRAA >60 05/28/2019 1054    Assessment: 1.  Internal hemorrhoids with bleeding: Seen at time of exam today, up-to-date with colonoscopy, these are the cause of recent rectal bleeding which was 1 episode a month ago and has seen none since  Plan: 1.  Prescribed Hydrocortisone suppositories 25 mg twice daily x 1 week with 1 refill #14 2.  Discussed bowel habits with the patient and possibly a fiber supplement 3.  Patient to follow in clinic with [place] as needed.  Hyacinth Meeker, PA-C [facility] 05/06/2023, 9:53 AM  Cc: Kaleen Mask, *

## 2023-05-06 NOTE — Patient Instructions (Addendum)
 We have sent the following medications to your pharmacy for you to pick up at your convenience: Hydrocortisone suppositories   Follow up as needed.  Due to recent changes in healthcare laws, you may see the results of your imaging and laboratory studies on MyChart before your provider has had a chance to review them.  We understand that in some cases there may be results that are confusing or concerning to you. Not all laboratory results come back in the same time frame and the provider may be waiting for multiple results in order to interpret others.  Please give [place] 48 hours in order for your provider to thoroughly review all the results before contacting the office for clarification of your results.   It was a pleasure to see you today!  Thank you for trusting me with your gastrointestinal care!

## 2023-05-15 NOTE — Progress Notes (Signed)
 ____________________________________________________________  Attending physician addendum:  Thank you for sending this case to me. I have reviewed the entire note and agree with the plan.   Amada Jupiter, MD  ____________________________________________________________
